# Patient Record
Sex: Female | Born: 1935
Health system: Southern US, Community
[De-identification: ages and names within clinical notes are randomized; demographics above are authoritative.]

## PROBLEM LIST (undated history)

## (undated) DIAGNOSIS — I10 Essential (primary) hypertension: Secondary | ICD-10-CM

## (undated) DIAGNOSIS — J449 Chronic obstructive pulmonary disease, unspecified: Secondary | ICD-10-CM

## (undated) DIAGNOSIS — D61818 Other pancytopenia: Secondary | ICD-10-CM

## (undated) DIAGNOSIS — K219 Gastro-esophageal reflux disease without esophagitis: Secondary | ICD-10-CM

## (undated) DIAGNOSIS — I059 Rheumatic mitral valve disease, unspecified: Secondary | ICD-10-CM

## (undated) DIAGNOSIS — M109 Gout, unspecified: Secondary | ICD-10-CM

## (undated) DIAGNOSIS — E785 Hyperlipidemia, unspecified: Secondary | ICD-10-CM

---

## 1977-06-26 HISTORY — PX: ABDOMINAL HYSTERECTOMY: SHX81

## 1998-11-24 ENCOUNTER — Ambulatory Visit (HOSPITAL_COMMUNITY): Admission: RE | Admit: 1998-11-24 | Discharge: 1998-11-24 | Payer: Self-pay | Admitting: Family Medicine

## 1998-11-24 ENCOUNTER — Encounter: Payer: Self-pay | Admitting: Family Medicine

## 2018-03-26 DIAGNOSIS — J449 Chronic obstructive pulmonary disease, unspecified: Secondary | ICD-10-CM

## 2018-03-26 DIAGNOSIS — I3481 Nonrheumatic mitral (valve) annulus calcification: Secondary | ICD-10-CM

## 2018-03-26 DIAGNOSIS — I059 Rheumatic mitral valve disease, unspecified: Secondary | ICD-10-CM

## 2018-03-26 HISTORY — DX: Chronic obstructive pulmonary disease, unspecified: J44.9

## 2018-03-26 HISTORY — DX: Rheumatic mitral valve disease, unspecified: I05.9

## 2018-03-26 HISTORY — DX: Nonrheumatic mitral (valve) annulus calcification: I34.81

## 2018-05-01 ENCOUNTER — Emergency Department (HOSPITAL_COMMUNITY): Payer: Medicare HMO

## 2018-05-01 ENCOUNTER — Observation Stay (HOSPITAL_COMMUNITY)
Admission: EM | Admit: 2018-05-01 | Discharge: 2018-05-03 | Disposition: A | Discharge status: Home / Self Care | Payer: Medicare HMO | Attending: Internal Medicine | Admitting: Internal Medicine

## 2018-05-01 ENCOUNTER — Other Ambulatory Visit: Payer: Self-pay

## 2018-05-01 ENCOUNTER — Encounter (HOSPITAL_COMMUNITY): Payer: Self-pay | Admitting: Pharmacy Technician

## 2018-05-01 DIAGNOSIS — Z833 Family history of diabetes mellitus: Secondary | ICD-10-CM | POA: Insufficient documentation

## 2018-05-01 DIAGNOSIS — I2693 Single subsegmental pulmonary embolism without acute cor pulmonale: Secondary | ICD-10-CM | POA: Diagnosis present

## 2018-05-01 DIAGNOSIS — I2699 Other pulmonary embolism without acute cor pulmonale: Principal | ICD-10-CM | POA: Insufficient documentation

## 2018-05-01 DIAGNOSIS — J439 Emphysema, unspecified: Secondary | ICD-10-CM | POA: Diagnosis not present

## 2018-05-01 DIAGNOSIS — R0789 Other chest pain: Secondary | ICD-10-CM | POA: Diagnosis not present

## 2018-05-01 DIAGNOSIS — D61818 Other pancytopenia: Secondary | ICD-10-CM | POA: Diagnosis not present

## 2018-05-01 DIAGNOSIS — G4733 Obstructive sleep apnea (adult) (pediatric): Secondary | ICD-10-CM | POA: Insufficient documentation

## 2018-05-01 DIAGNOSIS — Z66 Do not resuscitate: Secondary | ICD-10-CM | POA: Diagnosis not present

## 2018-05-01 DIAGNOSIS — I700 Atherosclerosis of aorta: Secondary | ICD-10-CM | POA: Insufficient documentation

## 2018-05-01 DIAGNOSIS — J449 Chronic obstructive pulmonary disease, unspecified: Secondary | ICD-10-CM | POA: Insufficient documentation

## 2018-05-01 DIAGNOSIS — M109 Gout, unspecified: Secondary | ICD-10-CM | POA: Insufficient documentation

## 2018-05-01 DIAGNOSIS — Z7901 Long term (current) use of anticoagulants: Secondary | ICD-10-CM | POA: Insufficient documentation

## 2018-05-01 DIAGNOSIS — Z880 Allergy status to penicillin: Secondary | ICD-10-CM | POA: Diagnosis not present

## 2018-05-01 DIAGNOSIS — E041 Nontoxic single thyroid nodule: Secondary | ICD-10-CM | POA: Diagnosis not present

## 2018-05-01 DIAGNOSIS — Z79899 Other long term (current) drug therapy: Secondary | ICD-10-CM | POA: Insufficient documentation

## 2018-05-01 DIAGNOSIS — I10 Essential (primary) hypertension: Secondary | ICD-10-CM | POA: Diagnosis not present

## 2018-05-01 DIAGNOSIS — K219 Gastro-esophageal reflux disease without esophagitis: Secondary | ICD-10-CM | POA: Insufficient documentation

## 2018-05-01 HISTORY — DX: Rheumatic mitral valve disease, unspecified: I05.9

## 2018-05-01 HISTORY — DX: Chronic obstructive pulmonary disease, unspecified: J44.9

## 2018-05-01 LAB — CBC WITH DIFFERENTIAL/PLATELET
Abs Immature Granulocytes: 0.01 10*3/uL (ref 0.00–0.07)
Basophils Absolute: 0 10*3/uL (ref 0.0–0.1)
Basophils Relative: 1 %
Eosinophils Absolute: 0.1 10*3/uL (ref 0.0–0.5)
Eosinophils Relative: 3 %
HCT: 33.5 % — ABNORMAL LOW (ref 36.0–46.0)
Hemoglobin: 10 g/dL — ABNORMAL LOW (ref 12.0–15.0)
Immature Granulocytes: 0 %
Lymphocytes Relative: 43 %
Lymphs Abs: 1.5 10*3/uL (ref 0.7–4.0)
MCH: 27.2 pg (ref 26.0–34.0)
MCHC: 29.9 g/dL — ABNORMAL LOW (ref 30.0–36.0)
MCV: 91.3 fL (ref 80.0–100.0)
Monocytes Absolute: 0.5 10*3/uL (ref 0.1–1.0)
Monocytes Relative: 13 %
Neutro Abs: 1.4 10*3/uL — ABNORMAL LOW (ref 1.7–7.7)
Neutrophils Relative %: 40 %
Platelets: 154 10*3/uL (ref 150–400)
RBC: 3.67 MIL/uL — ABNORMAL LOW (ref 3.87–5.11)
RDW: 14 % (ref 11.5–15.5)
WBC: 3.6 10*3/uL — ABNORMAL LOW (ref 4.0–10.5)
nRBC: 0 % (ref 0.0–0.2)

## 2018-05-01 LAB — I-STAT CHEM 8, ED
BUN: 23 mg/dL (ref 8–23)
CALCIUM ION: 1.3 mmol/L (ref 1.15–1.40)
CREATININE: 1 mg/dL (ref 0.44–1.00)
Chloride: 102 mmol/L (ref 98–111)
GLUCOSE: 103 mg/dL — AB (ref 70–99)
HCT: 33 % — ABNORMAL LOW (ref 36.0–46.0)
HEMOGLOBIN: 11.2 g/dL — AB (ref 12.0–15.0)
Potassium: 4 mmol/L (ref 3.5–5.1)
Sodium: 137 mmol/L (ref 135–145)
TCO2: 27 mmol/L (ref 22–32)

## 2018-05-01 LAB — COMPREHENSIVE METABOLIC PANEL
ALT: 17 U/L (ref 0–44)
AST: 28 U/L (ref 15–41)
Albumin: 4 g/dL (ref 3.5–5.0)
Alkaline Phosphatase: 38 U/L (ref 38–126)
Anion gap: 9 (ref 5–15)
BUN: 22 mg/dL (ref 8–23)
CO2: 23 mmol/L (ref 22–32)
Calcium: 9.8 mg/dL (ref 8.9–10.3)
Chloride: 103 mmol/L (ref 98–111)
Creatinine, Ser: 0.96 mg/dL (ref 0.44–1.00)
GFR calc Af Amer: >60 mL/min (ref >60)
GFR calc non Af Amer: 54 mL/min — ABNORMAL LOW (ref >60)
Glucose, Bld: 106 mg/dL — ABNORMAL HIGH (ref 70–99)
Potassium: 4 mmol/L (ref 3.5–5.1)
Sodium: 135 mmol/L (ref 135–145)
Total Bilirubin: 0.4 mg/dL (ref 0.3–1.2)
Total Protein: 7.2 g/dL (ref 6.5–8.1)

## 2018-05-01 LAB — I-STAT TROPONIN, ED
TROPONIN I, POC: 0 ng/mL (ref 0.00–0.08)
Troponin i, poc: 0.01 ng/mL (ref 0.00–0.08)

## 2018-05-01 MED ORDER — HEPARIN BOLUS VIA INFUSION
4000.0000 [IU] | Freq: Once | INTRAVENOUS | Status: AC
  Administered 2018-05-02: 4000 [IU] via INTRAVENOUS
  Filled 2018-05-01: qty 4000

## 2018-05-01 MED ORDER — ALBUTEROL SULFATE (2.5 MG/3ML) 0.083% IN NEBU
5.0000 mg | INHALATION_SOLUTION | Freq: Once | RESPIRATORY_TRACT | Status: AC
  Administered 2018-05-01: 5 mg via RESPIRATORY_TRACT
  Filled 2018-05-01: qty 6

## 2018-05-01 MED ORDER — IPRATROPIUM BROMIDE 0.02 % IN SOLN
0.5000 mg | Freq: Once | RESPIRATORY_TRACT | Status: AC
  Administered 2018-05-01: 0.5 mg via RESPIRATORY_TRACT
  Filled 2018-05-01: qty 2.5

## 2018-05-01 MED ORDER — FAMOTIDINE IN NACL 20-0.9 MG/50ML-% IV SOLN
20.0000 mg | Freq: Once | INTRAVENOUS | Status: AC
  Administered 2018-05-01: 20 mg via INTRAVENOUS
  Filled 2018-05-01: qty 50

## 2018-05-01 MED ORDER — IOPAMIDOL (ISOVUE-370) INJECTION 76%
INTRAVENOUS | Status: AC
  Filled 2018-05-01: qty 100

## 2018-05-01 MED ORDER — HEPARIN (PORCINE) 25000 UT/250ML-% IV SOLN
1100.0000 [IU]/h | INTRAVENOUS | Status: DC
  Administered 2018-05-02: 1100 [IU]/h via INTRAVENOUS
  Filled 2018-05-01: qty 250

## 2018-05-01 MED ORDER — IOPAMIDOL (ISOVUE-370) INJECTION 76%
100.0000 mL | Freq: Once | INTRAVENOUS | Status: AC | PRN
  Administered 2018-05-01: 100 mL via INTRAVENOUS

## 2018-05-01 NOTE — ED Notes (Signed)
Patient transported to CT 

## 2018-05-01 NOTE — ED Provider Notes (Signed)
[facility] EMERGENCY DEPARTMENT Provider Note   CSN: [id number] Arrival date & time: 05/01/18  2024     History   Chief Complaint Chief Complaint  Patient presents with  . Shortness of Breath    HPI Timmi Devora is a 82 y.o. female history of COPD, hypertension, thyroid nodule here presenting with shortness of breath.  Patient has intermittent shortness of breath for the last several months.  Patient occasionally has spells where she had trouble breathing.  This afternoon, patient an episode where she could not catch her breath and called her family.  Her family called EMS and brought her to the ER.  Of note, she saw pulmonology about a month ago and was diagnosed with COPD.  She is not currently on any steroids or inhalers.  Patient also had recent thyroid nodule and had a biopsy by ENT and a normal TSH.  Patient was seen in the ED and notified about 2 weeks ago for the same complaint and troponins were negative at that time BNP was normal.  Patient was supposed to see cardiology next month and patient does not have known coronary artery disease or stents in her heart.  Patient has no recent travel or history of blood clots.   The history is provided by the patient and a relative.    History reviewed. No pertinent past medical history.  There are no active problems to display for this patient.   History reviewed. No pertinent surgical history.   OB History   None      Home Medications    Prior to Admission medications   Not on File    Family History No family history on file.  Social History Social History   Tobacco Use  . Smoking status: Not on file  Substance Use Topics  . Alcohol use: Not on file  . Drug use: Not on file     Allergies   Patient has no allergy information on record.   Review of Systems Review of Systems  Respiratory: Positive for shortness of breath.   All other systems reviewed and are  negative.    Physical Exam Updated Vital Signs BP (!) 171/72   Pulse 68   Temp 98 F (36.7 C) (Oral)   Resp 16   Ht 5\' 4"  (1.626 m)   Wt 86.6 kg   SpO2 97%   BMI 32.79 kg/m   Physical Exam  Constitutional: She is oriented to person, place, and time.  Slightly anxious, well appearing   HENT:  Head: Normocephalic.  Mouth/Throat: Oropharynx is clear and moist.  Eyes: Pupils are equal, round, and reactive to light. EOM are normal.  Neck: Normal range of motion. Neck supple.  Cardiovascular: Normal rate and regular rhythm.  Pulmonary/Chest: Effort normal.  Diminished breath sounds bilaterally, no obvious wheezing   Abdominal: Soft. Bowel sounds are normal.  Musculoskeletal: Normal range of motion.       Right lower leg: Normal. She exhibits no tenderness and no edema.       Left lower leg: Normal. She exhibits no tenderness and no edema.  Neurological: She is alert and oriented to person, place, and time.  Skin: Skin is warm. Capillary refill takes less than 2 seconds.  Psychiatric: She has a normal mood and affect. Her behavior is normal.  Nursing note and vitals reviewed.    ED Treatments / Results  Labs (all labs ordered are listed, but only abnormal results are displayed) Labs Reviewed  CBC WITH DIFFERENTIAL/PLATELET - Abnormal; Notable for the following components:      Result Value   WBC 3.6 (*)    RBC 3.67 (*)    Hemoglobin 10.0 (*)    HCT 33.5 (*)    MCHC 29.9 (*)    Neutro Abs 1.4 (*)    All other components within normal limits  COMPREHENSIVE METABOLIC PANEL - Abnormal; Notable for the following components:   Glucose, Bld 106 (*)    GFR calc non Af Amer 54 (*)    All other components within normal limits  I-STAT CHEM 8, ED - Abnormal; Notable for the following components:   Glucose, Bld 103 (*)    Hemoglobin 11.2 (*)    HCT 33.0 (*)    All other components within normal limits  I-STAT TROPONIN, ED    EKG None  Radiology Dg Chest 2  View  Result Date: 05/01/2018 CLINICAL DATA:  Shortness of breath EXAM: CHEST - 2 VIEW COMPARISON:  04/21/2016 FINDINGS: The heart size and mediastinal contours are within normal limits. Aortic atherosclerosis. Both lungs are clear. IMPRESSION: No active cardiopulmonary disease. Electronically Signed   By: Jasmine Pang M.D.   On: 05/01/2018 21:52    Procedures Procedures (including critical care time)  CRITICAL CARE Performed by: Richardean Canal   Total critical care time:30 minutes  Critical care time was exclusive of separately billable procedures and treating other patients.  Critical care was necessary to treat or prevent imminent or life-threatening deterioration.  Critical care was time spent personally by me on the following activities: development of treatment plan with patient and/or surrogate as well as nursing, discussions with consultants, evaluation of patient's response to treatment, examination of patient, obtaining history from patient or surrogate, ordering and performing treatments and interventions, ordering and review of laboratory studies, ordering and review of radiographic studies, pulse oximetry and re-evaluation of patient's condition.   Medications Ordered in ED Medications  iopamidol (ISOVUE-370) 76 % injection (has no administration in time range)  albuterol (PROVENTIL) (2.5 MG/3ML) 0.083% nebulizer solution 5 mg (5 mg Nebulization Given 05/01/18 2055)  ipratropium (ATROVENT) nebulizer solution 0.5 mg (0.5 mg Nebulization Given 05/01/18 2055)  famotidine (PEPCID) IVPB 20 mg premix (0 mg Intravenous Stopped 05/01/18 2234)     Initial Impression / Assessment and Plan / ED Course  I have reviewed the triage vital signs and the nursing notes.  Pertinent labs & imaging results that were available during my care of the patient were reviewed by me and considered in my medical decision making (see chart for details).    Mikaylee Arseneau is a 82 y.o. female here  with intermittent palpitations, shortness of breath. Etiology seemed unclear. Patient had multiple PCP visits, pulmonology evaluation and recent ED visit with no clear answer. I wonder if she has mild bronchitis causing her symptoms. Also consider PE as well. Will get labs, CTA chest, give neb and reassess.    11:49 PM CTA showed possible non occlusive small PEs. Given her age and comorbidities, will start heparin and admit for DVT study and observation. Hospitalist to admit.   Final Clinical Impressions(s) / ED Diagnoses   Final diagnoses:  None    ED Discharge Orders    None       Charlynne Pander, MD 05/01/18 2350

## 2018-05-01 NOTE — ED Notes (Signed)
Pt returned from imaging.

## 2018-05-01 NOTE — Progress Notes (Signed)
ANTICOAGULATION CONSULT NOTE - Initial Consult  Pharmacy Consult for Heparin Indication: pulmonary embolus  Allergies not on file  Patient Measurements: Height: 5\' 4"  (162.6 cm) Weight: 191 lb (86.6 kg) IBW/kg (Calculated) : 54.7 Heparin Dosing Weight: 75 kg  Vital Signs: Temp: 98 F (36.7 C) (11/06 2037) Temp Source: Oral (11/06 2037) BP: 146/67 (11/06 2315) Pulse Rate: 79 (11/06 2315)  Labs: Recent Labs    05/01/18 2040 05/01/18 2112  HGB 10.0* 11.2*  HCT 33.5* 33.0*  PLT 154  --   CREATININE 0.96 1.00    Estimated Creatinine Clearance: 47.8 mL/min (by C-G formula based on SCr of 1 mg/dL).   Medical History: History reviewed. No pertinent past medical history.  Medications:  Allopurinol  Xalatan  Crestor  Travatan  Diovan HCT  Assessment: 82 y.o. female with RLL PE for heparin  Goal of Therapy:  Heparin level 0.3-0.7 units/ml Monitor platelets by anticoagulation protocol: Yes   Plan:  Heparin 4000 units IV bolus, then start heparin 1100 units/hr Check heparin level in 8 hours.   Rebekah Macdonald, Gary Fleet 05/01/2018,11:42 PM

## 2018-05-01 NOTE — ED Triage Notes (Signed)
Pt arrives POV with reports of intermittent sob for "a while" worsening over the last 6 weeks. Pt speaking in complete sentences. NAD.

## 2018-05-02 ENCOUNTER — Other Ambulatory Visit: Payer: Self-pay

## 2018-05-02 ENCOUNTER — Encounter (HOSPITAL_COMMUNITY): Payer: Self-pay | Admitting: Emergency Medicine

## 2018-05-02 DIAGNOSIS — I2694 Multiple subsegmental pulmonary emboli without acute cor pulmonale: Secondary | ICD-10-CM

## 2018-05-02 DIAGNOSIS — I2699 Other pulmonary embolism without acute cor pulmonale: Secondary | ICD-10-CM | POA: Diagnosis present

## 2018-05-02 LAB — IRON AND TIBC
Iron: 48 ug/dL (ref 28–170)
Saturation Ratios: 15 % (ref 10.4–31.8)
TIBC: 315 ug/dL (ref 250–450)
UIBC: 267 ug/dL

## 2018-05-02 LAB — PROTIME-INR
INR: 1.07
Prothrombin Time: 13.8 seconds (ref 11.4–15.2)

## 2018-05-02 LAB — CBC
HCT: 31.5 % — ABNORMAL LOW (ref 36.0–46.0)
Hemoglobin: 9.6 g/dL — ABNORMAL LOW (ref 12.0–15.0)
MCH: 27.4 pg (ref 26.0–34.0)
MCHC: 30.5 g/dL (ref 30.0–36.0)
MCV: 89.7 fL (ref 80.0–100.0)
PLATELETS: 151 10*3/uL (ref 150–400)
RBC: 3.51 MIL/uL — ABNORMAL LOW (ref 3.87–5.11)
RDW: 14 % (ref 11.5–15.5)
WBC: 3.6 10*3/uL — AB (ref 4.0–10.5)
nRBC: 0 % (ref 0.0–0.2)

## 2018-05-02 LAB — BRAIN NATRIURETIC PEPTIDE: B Natriuretic Peptide: 90 pg/mL (ref 0.0–100.0)

## 2018-05-02 LAB — BASIC METABOLIC PANEL
Anion gap: 10 (ref 5–15)
BUN: 19 mg/dL (ref 8–23)
CALCIUM: 9.9 mg/dL (ref 8.9–10.3)
CO2: 22 mmol/L (ref 22–32)
Chloride: 104 mmol/L (ref 98–111)
Creatinine, Ser: 0.92 mg/dL (ref 0.44–1.00)
GFR calc Af Amer: >60 mL/min (ref >60)
GFR, EST NON AFRICAN AMERICAN: 57 mL/min — AB (ref >60)
GLUCOSE: 104 mg/dL — AB (ref 70–99)
Potassium: 3.7 mmol/L (ref 3.5–5.1)
SODIUM: 136 mmol/L (ref 135–145)

## 2018-05-02 LAB — VITAMIN B12: VITAMIN B 12: 571 pg/mL (ref 180–914)

## 2018-05-02 LAB — TROPONIN I

## 2018-05-02 LAB — APTT: APTT: 131 s — AB (ref 24–36)

## 2018-05-02 LAB — FERRITIN: Ferritin: 32 ng/mL (ref 11–307)

## 2018-05-02 MED ORDER — ALUM & MAG HYDROXIDE-SIMETH 200-200-20 MG/5ML PO SUSP
15.0000 mL | Freq: Four times a day (QID) | ORAL | Status: DC | PRN
  Administered 2018-05-02 – 2018-05-03 (×4): 15 mL via ORAL
  Filled 2018-05-02 (×4): qty 30

## 2018-05-02 MED ORDER — OMEGA-3-ACID ETHYL ESTERS 1 G PO CAPS
1.0000 g | ORAL_CAPSULE | Freq: Two times a day (BID) | ORAL | Status: DC
  Administered 2018-05-02 – 2018-05-03 (×3): 1 g via ORAL
  Filled 2018-05-02 (×3): qty 1

## 2018-05-02 MED ORDER — ENOXAPARIN SODIUM 80 MG/0.8ML ~~LOC~~ SOLN
80.0000 mg | Freq: Once | SUBCUTANEOUS | Status: AC
  Administered 2018-05-02: 80 mg via SUBCUTANEOUS
  Filled 2018-05-02: qty 0.8

## 2018-05-02 MED ORDER — IPRATROPIUM-ALBUTEROL 0.5-2.5 (3) MG/3ML IN SOLN
3.0000 mL | Freq: Four times a day (QID) | RESPIRATORY_TRACT | Status: DC
  Administered 2018-05-02: 3 mL via RESPIRATORY_TRACT
  Filled 2018-05-02: qty 3

## 2018-05-02 MED ORDER — IPRATROPIUM-ALBUTEROL 0.5-2.5 (3) MG/3ML IN SOLN: 3.0000 mL | Freq: Four times a day (QID) | RESPIRATORY_TRACT | Status: DC

## 2018-05-02 MED ORDER — ENOXAPARIN SODIUM 80 MG/0.8ML ~~LOC~~ SOLN: 80.0000 mg | Freq: Two times a day (BID) | SUBCUTANEOUS | Status: DC

## 2018-05-02 MED ORDER — SIMETHICONE 80 MG PO CHEW
80.0000 mg | CHEWABLE_TABLET | Freq: Four times a day (QID) | ORAL | Status: DC | PRN
  Administered 2018-05-03: 80 mg via ORAL
  Filled 2018-05-02 (×3): qty 1

## 2018-05-02 MED ORDER — APIXABAN 5 MG PO TABS: 5.0000 mg | ORAL_TABLET | Freq: Two times a day (BID) | ORAL | Status: DC

## 2018-05-02 MED ORDER — APIXABAN 5 MG PO TABS
10.0000 mg | ORAL_TABLET | Freq: Two times a day (BID) | ORAL | Status: DC
  Administered 2018-05-02 – 2018-05-03 (×2): 10 mg via ORAL
  Filled 2018-05-02 (×2): qty 2

## 2018-05-02 MED ORDER — ALBUTEROL SULFATE (2.5 MG/3ML) 0.083% IN NEBU: 3.0000 mL | INHALATION_SOLUTION | Freq: Four times a day (QID) | RESPIRATORY_TRACT | Status: DC | PRN

## 2018-05-02 MED ORDER — ALLOPURINOL 100 MG PO TABS
100.0000 mg | ORAL_TABLET | Freq: Every day | ORAL | Status: DC
  Administered 2018-05-02 – 2018-05-03 (×2): 100 mg via ORAL
  Filled 2018-05-02 (×2): qty 1

## 2018-05-02 MED ORDER — UMECLIDINIUM BROMIDE 62.5 MCG/INH IN AEPB
1.0000 | INHALATION_SPRAY | Freq: Every day | RESPIRATORY_TRACT | Status: DC
  Administered 2018-05-02: 1 via RESPIRATORY_TRACT
  Filled 2018-05-02: qty 7

## 2018-05-02 MED ORDER — ENOXAPARIN SODIUM 100 MG/ML ~~LOC~~ SOLN: 1.0000 mg/kg | Freq: Two times a day (BID) | SUBCUTANEOUS | Status: DC

## 2018-05-02 MED ORDER — ROSUVASTATIN CALCIUM 10 MG PO TABS
10.0000 mg | ORAL_TABLET | Freq: Every day | ORAL | Status: DC
  Administered 2018-05-02: 10 mg via ORAL
  Filled 2018-05-02: qty 1

## 2018-05-02 MED ORDER — PANTOPRAZOLE SODIUM 40 MG PO TBEC
40.0000 mg | DELAYED_RELEASE_TABLET | Freq: Every day | ORAL | Status: DC
  Administered 2018-05-02 – 2018-05-03 (×2): 40 mg via ORAL
  Filled 2018-05-02 (×2): qty 1

## 2018-05-02 MED ORDER — LATANOPROST 0.005 % OP SOLN
1.0000 [drp] | Freq: Every day | OPHTHALMIC | Status: DC
  Filled 2018-05-02 (×2): qty 2.5

## 2018-05-02 MED ORDER — VITAMIN D 25 MCG (1000 UNIT) PO TABS
5000.0000 [IU] | ORAL_TABLET | Freq: Every day | ORAL | Status: DC
  Administered 2018-05-02 – 2018-05-03 (×2): 5000 [IU] via ORAL

## 2018-05-02 MED ORDER — VALSARTAN-HYDROCHLOROTHIAZIDE 160-12.5 MG PO TABS: 1.0000 | ORAL_TABLET | Freq: Every day | ORAL | Status: DC

## 2018-05-02 MED ORDER — FLUTICASONE PROPIONATE 50 MCG/ACT NA SUSP
1.0000 | Freq: Every day | NASAL | Status: DC | PRN
  Filled 2018-05-02: qty 16

## 2018-05-02 MED ORDER — ACETAMINOPHEN 325 MG PO TABS
650.0000 mg | ORAL_TABLET | Freq: Two times a day (BID) | ORAL | Status: DC
  Administered 2018-05-02 – 2018-05-03 (×4): 650 mg via ORAL
  Filled 2018-05-02 (×4): qty 2

## 2018-05-02 MED ORDER — IRBESARTAN 150 MG PO TABS
150.0000 mg | ORAL_TABLET | Freq: Every day | ORAL | Status: DC
  Administered 2018-05-02 – 2018-05-03 (×2): 150 mg via ORAL
  Filled 2018-05-02 (×2): qty 1

## 2018-05-02 MED ORDER — SODIUM CHLORIDE 0.9% FLUSH
3.0000 mL | Freq: Two times a day (BID) | INTRAVENOUS | Status: DC
  Administered 2018-05-02 – 2018-05-03 (×4): 3 mL via INTRAVENOUS

## 2018-05-02 MED ORDER — HYDROCHLOROTHIAZIDE 12.5 MG PO CAPS
12.5000 mg | ORAL_CAPSULE | Freq: Every day | ORAL | Status: DC
  Administered 2018-05-02 – 2018-05-03 (×2): 12.5 mg via ORAL
  Filled 2018-05-02 (×2): qty 1

## 2018-05-02 MED ORDER — IPRATROPIUM-ALBUTEROL 0.5-2.5 (3) MG/3ML IN SOLN
3.0000 mL | Freq: Two times a day (BID) | RESPIRATORY_TRACT | Status: DC
  Administered 2018-05-02 – 2018-05-03 (×2): 3 mL via RESPIRATORY_TRACT
  Filled 2018-05-02 (×2): qty 3

## 2018-05-02 NOTE — Progress Notes (Signed)
Pt complaining of upper sternal chest pain described as tightness at an intensity of 6.  Dr. Benjamine Mola was notified and will assess patient.

## 2018-05-02 NOTE — Care Management Note (Signed)
Case Management Note  Patient Details  Name: Rebekah Macdonald MRN: [id number] Date of Birth: Aug 17, 1935  Subjective/Objective: 82yo female presented with SOB.                    Action/Plan: CM met with patient and her 2 daughters to discuss transitional needs. Patient lives at home alone, independent with ADLs with no DME in use. PCP verified as: Dr. Romeo Rabon; pharmacy of choice: [facility]. Eliquis benefits check complete, with est monthly cost $45, with patient/daughters informed.Patient will receive a 30 day free supply of Eliquis, and has requested to utilize [facility] pharmacy service. Patient's daughters will provide transportation home. No further CM needs.  Expected Discharge Date:  05/03/18               Expected Discharge Plan:  Home/Self Care  In-House Referral:  NA  Discharge planning Services  CM Consult, Medication Assistance(Anticoagulation benefits check)  Post Acute Care Choice:  NA Choice offered to:  NA  DME Arranged:  N/A DME Agency:  NA  HH Arranged:  NA HH Agency:  NA  Status of Service:  Completed, signed off  If discussed at [place] of Stay Meetings, dates discussed:    Additional Comments:  Midge Minium RN, BSN, NCM-BC, ACM-RN [phone] 05/02/2018, 1:19 PM

## 2018-05-02 NOTE — Progress Notes (Signed)
Patient admitted into 5c12 accompanied by daughter. On arrival patient A/O x4, ambulatory and Neuro intact. Oriented to the room and call bell placed within reach.

## 2018-05-02 NOTE — Discharge Instructions (Signed)
Information on my medicine - ELIQUIS (apixaban)  This medication education was reviewed with me or my healthcare representative as part of my discharge preparation.    Why was Eliquis prescribed for you? Eliquis was prescribed to treat blood clots that may have been found in the veins of your legs (deep vein thrombosis) or in your lungs (pulmonary embolism) and to reduce the risk of them occurring again.  What do You need to know about Eliquis ? The starting dose is 10 mg (two 5 mg tablets) taken TWICE daily for the FIRST SEVEN (7) DAYS, then on 11/14 evening the dose is reduced to ONE 5 mg tablet taken TWICE daily.  Eliquis may be taken with or without food.   Try to take the dose about the same time in the morning and in the evening. If you have difficulty swallowing the tablet whole please discuss with your pharmacist how to take the medication safely.  Take Eliquis exactly as prescribed and DO NOT stop taking Eliquis without talking to the doctor who prescribed the medication.  Stopping may increase your risk of developing a new blood clot.  Refill your prescription before you run out.  After discharge, you should have regular check-up appointments with your healthcare provider that is prescribing your Eliquis.    What do you do if you miss a dose? If a dose of ELIQUIS is not taken at the scheduled time, take it as soon as possible on the same day and twice-daily administration should be resumed. The dose should not be doubled to make up for a missed dose.  Important Safety Information A possible side effect of Eliquis is bleeding. You should call your healthcare provider right away if you experience any of the following: ? Bleeding from an injury or your nose that does not stop. ? Unusual colored urine (red or dark brown) or unusual colored stools (red or black). ? Unusual bruising for unknown reasons. ? A serious fall or if you hit your head (even if there is no  bleeding).  Some medicines may interact with Eliquis and might increase your risk of bleeding or clotting while on Eliquis. To help avoid this, consult your healthcare provider or pharmacist prior to using any new prescription or non-prescription medications, including herbals, vitamins, non-steroidal anti-inflammatory drugs (NSAIDs) and supplements.  This website has more information on Eliquis (apixaban): http://www.eliquis.com/eliquis/home

## 2018-05-02 NOTE — Care Management Obs Status (Signed)
MEDICARE OBSERVATION STATUS NOTIFICATION   Patient Details  Name: Rebekah Macdonald MRN: [id number] Date of Birth: [birth year]   Medicare Observation Status Notification Given:  Yes    Colleen Can RN, BSN, NCM-BC, ACM-RN [phone] 05/02/2018, 4:28 PM

## 2018-05-02 NOTE — Progress Notes (Signed)
ANTICOAGULATION CONSULT NOTE - Follow Up Consult  Pharmacy Consult for Lovenox >> Apixaban Indication: pulmonary embolus  Allergies  Allergen Reactions  . Ace Inhibitors Cough  . Penicillins Nausea And Vomiting    Patient Measurements: Height: 5' 4.5" (163.8 cm) Weight: 191 lb 12.8 oz (87 kg) IBW/kg (Calculated) : 55.85  Vital Signs: Temp: 97.8 F (36.6 C) (11/07 0530) Temp Source: Oral (11/07 0530) BP: 154/67 (11/07 0530) Pulse Rate: 71 (11/07 0818)  Labs: Recent Labs    05/01/18 2040 05/01/18 2112 05/02/18 0116  HGB 10.0* 11.2* 9.6*  HCT 33.5* 33.0* 31.5*  PLT 154  --  151  APTT  --   --  131*  LABPROT  --   --  13.8  INR  --   --  1.07  CREATININE 0.96 1.00 0.92    Estimated Creatinine Clearance: 52.6 mL/min (by C-G formula based on SCr of 0.92 mg/dL).   Assessment: 39 YOF who presented on 11/7 with SOB and found to have a small RLL PE. Pharmacy consulted to transition the patient from lovenox to apixaban.   The patient last received lovenox 80 mg this morning around 0400  Goal of Therapy:  Appropriate anticoagulation for indication and hepatic/renal function    Plan:  - D/c Lovenox - Start Apixaban 10 mg bid x 7 days followed by 5 mg bid - Educated patient and daughters on new medication - Pharmacy will sign off of consult and continue to monitor peripherally  Thank you for allowing pharmacy to be a part of this patient's care.  Georgina Pillion, PharmD, BCPS Clinical Pharmacist Pager: [phone] Clinical phone for 05/02/2018 from 7a-3:30p: [phone] If after 3:30p, please call main pharmacy at: [phone] Please check AMION for all [facility] Pharmacy numbers 05/02/2018 12:51 PM

## 2018-05-02 NOTE — H&P (Signed)
History and Physical   Rebekah Macdonald ZOX:[id number] DOB: September 12, 1935 DOA: 05/01/2018  PCP: [name], PA-C  Chief Complaint: shortness of breath  History is obtained via chart review, as well as patient and daughter report.  HPI:  This is an 82 year old woman with medical problems including pulmonary arterial hypertension based on CT scan, chronic anemia with baseline hemoglobin of 10, with chronic pancytopenia nontransfusion dependent, mild COPD based on PFTs in the year 2019, untreated OSA-reports intolerance to positive airway pressure, hypertension, chronic arthritis on acetaminophen twice daily, elevated cardiac risk on statin, gout on allopurinol, who lives independently, does not require assistance for ADLs and IADLs presenting with worsening dyspnea.  Over the course of the past year, the patient cites a decrease in exercise tolerance.  She is accompanied by her daughter.  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  She has had an insidious onset of dyspnea on exertion over the past 9 to 10 months per her report.  She now has difficulty climbing a flight of stairs.  Her dyspnea has at times progressed to dyspnea at rest, she cites having "spells".  She denies a positional component.  Does not have productive cough, fevers, nausea, vomiting.  She does report that she had intermittent chest pressure, no current chest pain.  Intermittent palpitations.  Recent evaluations by her pulmonologist has resulted in cardiology referral.  In the month of October 2018 she underwent transthoracic echocardiogram which revealed EF of 70%, right ventricular systolic pressure of .  Underwent high-resolution CT on April 13, 2018 that revealed emphysema as well as enlarged pulmonary artery.  She was started on Spiriva in the outpatient setting.  Not oxygen dependent.   Due to her worsening dyspnea, sought medical evaluation today.  The patient denies any history of prior blood clots.  Specifically denies any family history of VTE.  Denies prolonged travel.  Reports mammogram and colonoscopy within the past year that were normal.  Denies any weight loss or B symptoms.  ED Course: In the emergency department heart rate normal, respiratory rate mildly elevated 22, systolic blood pressure 160s.  Lab evaluation remarkable for hemoglobin 10, MCV 91, total white count 3.6, plt 154.  BMP remarkable for creatinine of 1.  Troponin normal.  Chest x-ray did not reveal acute cardia pleural disease.  A CT PE study was obtained which revealed 2 possible tiny right lower lobe segmental/subsegmental emboli, as well as unchanged pulmonary arterial enlargement compatible with pulmonary arterial hypertension.  Review of Systems: A complete ROS was obtained; pertinent positives negatives are denoted in the HPI. Otherwise, all systems are negative.   Past medical hx: In addition to medical conditions outlined above, she has undergone hysterectomy for fibroids, as well as bilateral arthroscopic surgery to her knees.  Social History   Socioeconomic History  . Marital status: Widowed    Spouse name: Not on file  . Number of children: Not on file  . Years of education: Not on file  . Highest education level: Not on file  Occupational History  . Not on file  Social Needs  . Financial resource strain: Not on file  . Food insecurity:    Worry: Not on file    Inability: Not on file  . Transportation needs:    Medical: Not on file    Non-medical: Not on file  Tobacco Use  .  Smoking status: Not on file  Substance and Sexual Activity  . Alcohol use: Not on file  . Drug use: Not on file  . Sexual activity: Not on file  Lifestyle  . Physical activity:    Days per week: Not on file    Minutes per session: Not on file  . Stress: Not on file  Relationships  . Social connections:     Talks on phone: Not on file    Gets together: Not on file    Attends religious service: Not on file    Active member of club or organization: Not on file    Attends meetings of clubs or organizations: Not on file    Relationship status: Not on file  . Intimate partner violence:    Fear of current or ex partner: Not on file    Emotionally abused: Not on file    Physically abused: Not on file    Forced sexual activity: Not on file  Other Topics Concern  . Not on file  Social History Narrative  . Not on file   Family hx: Family history remarkable for mother with diabetes, deceased.  Father died of prostate cancer.  Physical Exam: Vitals:   05/01/18 2215 05/01/18 2230 05/01/18 2300 05/01/18 2315  BP: (!) 146/65 (!) 150/56 140/60 (!) 146/67  Pulse: 74 80 75 79  Resp: (!) 21 19 14 17   Temp:      TempSrc:      SpO2: 97% 97% 99% 98%  Weight:      Height:       General: Appears calm and comfortable.  Elderly black woman. ENT: Grossly normal hearing, MMM. Cardiovascular: RRR. No M/R/G. No LE edema or pain in b/l calf regions Respiratory: CTA bilaterally. No wheezes or crackles. Normal respiratory effort.  Breathing room air. Lung sounds distant. Abdomen: Soft, non-tender. No rebound or guarding..  Skin: No rash or induration seen on limited exam. Musculoskeletal: Grossly normal tone BUE/BLE. Appropriate ROM.  Psychiatric: Grossly normal mood and affect. Neurologic: Moves all extremities in coordinated fashion.  I have personally reviewed the following labs, culture data, and imaging studies.  Assessment/Plan:  #Pulmonary emboli Course: Has had CT of chest without contrast, TTE, and PFTs within the past few months.  Worsening dyspnea over the past year, now with worsening over the past 1-2 weeks with intermittent spells of dyspnea at rest and with minimal ambulation.  On admission, was found to have 2 small segmenta / subsegmental pulmonary emboli. A/P: Her acute on subacute /  chronic dyspnea is likely multifactorial - untreated OSA in setting of chronic anemia superimposed on mild COPD.  It is likely that pulmonary emboli has caused or contributed to acute worsening of dyspnea.  PESI score indicates low risk.   Hemodynamically stable.   No clear provoking factors.  Uncertain if PAH seen on CT is related to chronic thromboembolic disease pulmonary hypertension (CTEPH) or more so from untreated OSA.  Initially emergency medicine team started [facility] with heparin gtt; however, after talking with pharmacy team, will transition to therapeutic LMWH.  Asking case management to run pricing on affordable DOACs which will be reasonable to transition to as early as later in the day on 05/02/2018.  Patient and daughter understand benefits and burdens/risks to [facility], agreeable to start [facility].  #Other problems: -Chronic arthritis: not in exacerbation, continue acetaminophen BID -Gout: not in flare, continue home allopurinol -HTN: holding ARB / HCTZ for now, consider re-initiation later in day -Hx of pancytopenia,  now with mild leukopenia and anemia: hb appears at baseline (~10), no evidence of active bleed, monitor, is followed by outpatient hematology, Dr. Neil Crouch -COPD, mild: continue home Spiriva, as needed duo nebs -OSA: intolerant to CPAP, will likely need OP sleep study to determine optimal management  DVT prophylaxis: Subq Lovenox - treatment dose Code Status: DNR, but is willing to be intubated if respiratory failure Disposition Plan: Anticipate D/C home within 2 days Consults called: none Admission status: admit to hospital medicine   Laurell Roof, MD [facility] Page:[phone]  If 7PM-7AM, please contact night-coverage [email or web address] Password [email or web address]  This document was created using the aid of voice recognition / dication software.

## 2018-05-02 NOTE — Care Management (Signed)
#    2.  Rebekah Macdonald  @  [facility] [employer]            RX  # [phone]          APIXABAN : NONE FORMULARY  1. ELIQUIS  2.5 MG BID COVER- YES CO-PAY - $ 45.00 TIER- 3 DRUG PRIOR APPROVAL- NO  2. ELIQUIS  5 MG BID COVER- YES CO-PAY-$ 45.00 TIER- 3 DRUG PRIOR APPROVAL- NO  RIVAROXABAN : NONE FORMULARY  3. XARELTO 15 MG BID COVER- YES CO-PAY- $ 45.00 TIER- 3 DRUG PRIOR APPROVAL- NO  4. XARELTO  20 MG DAILY COVER- YES CO-PAY- $ 45.00 TIER- 3 DRUG PRIOR APPROVAL- NO  5. LOVENOX   80 MG SYRINGES COVER- NONE FORMULARY PRIOR APPROVAL- YES # [phone]  6. ENOXAPARIN  80 MG SYRINGES  COVER- : NONE FORMULARY PRIOR APPROVAL- YES # [phone]  PREFERRED PHARMACY : YES - [facility]

## 2018-05-02 NOTE — Progress Notes (Signed)
Patient admitted after midnight, please see H&P.  Here with PE.  Most medical records are in care everywhere through [place].  Will place on tele, get troponin.  Recent echo done at [facility]   Had episode of upper sternal tightness-- not clearly GI related but suspicious for, but will r/o cardiac etiology. Plan to watch overnight on tele with AM labs  Marlin Canary DO

## 2018-05-03 DIAGNOSIS — I2694 Multiple subsegmental pulmonary emboli without acute cor pulmonale: Secondary | ICD-10-CM | POA: Diagnosis not present

## 2018-05-03 MED ORDER — CALCIUM CARBONATE ANTACID 500 MG PO CHEW
1.0000 | CHEWABLE_TABLET | Freq: Three times a day (TID) | ORAL | Status: DC
  Administered 2018-05-03: 200 mg via ORAL
  Filled 2018-05-03: qty 1

## 2018-05-03 MED ORDER — ELIQUIS 5 MG VTE STARTER PACK: ORAL_TABLET | ORAL | 0 refills | Status: DC

## 2018-05-03 MED ORDER — SIMETHICONE 80 MG PO CHEW: 80.0000 mg | CHEWABLE_TABLET | Freq: Four times a day (QID) | ORAL | 0 refills | Status: DC | PRN

## 2018-05-03 MED ORDER — CALCIUM CARBONATE ANTACID 500 MG PO CHEW: 1.0000 | CHEWABLE_TABLET | Freq: Three times a day (TID) | ORAL | Status: DC | PRN

## 2018-05-03 MED ORDER — PANTOPRAZOLE SODIUM 40 MG PO TBEC: 40.0000 mg | DELAYED_RELEASE_TABLET | Freq: Every day | ORAL | 0 refills | Status: DC

## 2018-05-03 MED FILL — ELIQUIS STARTER PACK 5 MG T: 5 | 30 days supply | Qty: 74 | Fill #0

## 2018-05-03 NOTE — Discharge Summary (Signed)
Physician Discharge Summary  Rebekah Macdonald ZOX:[id number] DOB: 1935-11-11 DOA: 05/01/2018  PCP: [name], PA-C  Admit date: 05/01/2018 Discharge date: 05/03/2018  Admitted From: home Discharge disposition: home   Recommendations for Outpatient Follow-Up:   1. Cardiology follow up 2. GI referral if continues to have acid reflux   Discharge Diagnosis:   Active Problems:   Pulmonary embolism [facility])    Discharge Condition: Improved.  Diet recommendation: Low sodium, heart healthy  Wound care: None.  Code status: Full.   History of Present Illness:   82 year old woman with medical problems including pulmonary arterial hypertension based on CT scan, chronic anemia with baseline hemoglobin of 10, with chronic pancytopenia nontransfusion dependent, mild COPD based on PFTs in the year 2019, untreated OSA-reports intolerance to positive airway pressure, hypertension, chronic arthritis on acetaminophen twice daily, elevated cardiac risk on statin, gout on allopurinol, who lives independently, does not require assistance for ADLs and IADLs presenting with worsening dyspnea.  Over the course of the past year, the patient cites a decrease in exercise tolerance.  She is accompanied by her daughter.  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  She has had an insidious onset of dyspnea on exertion over the past 9 to 10 months per her report.  She now has difficulty climbing a flight of stairs.  Her dyspnea has at times progressed to dyspnea at rest, she cites having "spells".  She denies a positional component.  Does not have productive cough, fevers, nausea, vomiting.  She does report that she had intermittent chest pressure, no current chest pain.  Intermittent palpitations.  Recent evaluations by her pulmonologist has resulted in cardiology referral.  In the  month of October 2018 she underwent transthoracic echocardiogram which revealed EF of 70%, right ventricular systolic pressure of .  Underwent high-resolution CT on April 13, 2018 that revealed emphysema as well as enlarged pulmonary artery.  She was started on Spiriva in the outpatient setting.  Not oxygen dependent.  Due to her worsening dyspnea, sought medical evaluation today.   Hospital Course by Problem:   Pulmonary emboli Course: Has had CT of chest without contrast, TTE, and PFTs within the past few months.  Worsening dyspnea over the past year, now with worsening over the past 1-2 weeks with intermittent spells of dyspnea at rest and with minimal ambulation.  On admission, was found to have 2 small segmenta / subsegmental pulmonary emboli. -acute on subacute / chronic dyspnea is likely multifactorial - untreated OSA in setting of chronic anemia superimposed on mild COPD.  It is likely that pulmonary emboli has caused or contributed to acute worsening of dyspnea.  PESI score indicates low risk.   Hemodynamically stable.   No clear provoking factors.  Uncertain if PAH seen on CT is related to chronic thromboembolic disease pulmonary hypertension (CTEPH) or more so from untreated OSA.   -treat with eliquis  GERD -PPI -tums  Gout: not in flare, continue home allopurinol  HTN: resume home meds  Hx of pancytopenia, now with mild leukopenia and anemia: hb appears at baseline (~10), no evidence of active bleed, monitor, is followed by outpatient hematology, Dr. Neil Crouch  COPD, mild: spiriva not helpful  OSA: intolerant to CPAP, will likely need OP sleep study to determine optimal management    Medical Consultants:  Discharge Exam:   Vitals:   05/03/18 0511 05/03/18 1155  BP: 123/68 134/60  Pulse: 78 84  Resp: 18   Temp: 97.6 F (36.4 C) 98.5 F (36.9 C)  SpO2: 97% 95%   Vitals:   05/02/18 1725 05/02/18 2046 05/03/18 0511 05/03/18 1155  BP: (!)  130/57 (!) 135/48 123/68 134/60  Pulse: 72 85 78 84  Resp: 16 16 18    Temp: 98.7 F (37.1 C) 98.7 F (37.1 C) 97.6 F (36.4 C) 98.5 F (36.9 C)  TempSrc: Oral Oral Oral Oral  SpO2: 97% 98% 97% 95%  Weight:      Height:        General exam: Appears calm and comfortable.    The results of significant diagnostics from this hospitalization (including imaging, microbiology, ancillary and laboratory) are listed below for reference.     Procedures and Diagnostic Studies:   Dg Chest 2 View  Result Date: 05/01/2018 CLINICAL DATA:  Shortness of breath EXAM: CHEST - 2 VIEW COMPARISON:  04/21/2016 FINDINGS: The heart size and mediastinal contours are within normal limits. Aortic atherosclerosis. Both lungs are clear. IMPRESSION: No active cardiopulmonary disease. Electronically Signed   By: Jasmine Pang M.D.   On: 05/01/2018 21:52   Ct Angio Chest Pe W And/or Wo Contrast  Result Date: 05/01/2018 CLINICAL DATA:  Chest pain with shortness of breath for 6 weeks. EXAM: CT ANGIOGRAPHY CHEST WITH CONTRAST TECHNIQUE: Multidetector CT imaging of the chest was performed using the standard protocol during bolus administration of intravenous contrast. Multiplanar CT image reconstructions and MIPs were obtained to evaluate the vascular anatomy. CONTRAST:  ISOVUE-370 IOPAMIDOL (ISOVUE-370) INJECTION 76% COMPARISON:  High-resolution chest CT 04/03/2018 FINDINGS: Cardiovascular: Pulmonary arterial opacification is adequate. A very small, thin linear filling defect is questioned in a right lower lobe segmental artery (series 9, image 186). An additional small linear filling defect is questioned in a right lower lobe subsegmental branch with assessment mildly limited by an adjacent vein (series 9, image 202). There are no lobar or more central emboli. Thoracic aortic atherosclerosis is noted without aneurysm. There is an aberrant right subclavian artery. The pulmonary arteries remain enlarged with the main  pulmonary artery measuring 3.4 cm in diameter. Coronary artery atherosclerosis is noted. The heart is normal in size. There is no pericardial effusion. Mediastinum/Nodes: The thyroid is again noted to be heterogeneous with mild asymmetric enlargement of the left lobe as well as a nodule extending inferiorly from the isthmus, partially obscured by streak artifact today and evaluated by ultrasound on 04/18/2018. No enlarged axillary, mediastinal, or hilar lymph nodes are identified. The esophagus is unremarkable. Lungs/Pleura: No pleural effusion or pneumothorax. Mild centrilobular and paraseptal emphysema. Mild respiratory motion artifact with mild dependent atelectasis bilaterally. No mass. Upper Abdomen: No acute abnormality. Musculoskeletal: No acute osseous abnormality or suspicious osseous lesion. Mild thoracic disc and facet degeneration. Review of the MIP images confirms the above findings. IMPRESSION: 1. Two possible tiny right lower lobe segmental/subsegmental emboli. No occlusive or more proximal embolus. 2. Unchanged pulmonary arterial enlargement compatible with pulmonary arterial hypertension. 3. Aortic Atherosclerosis (ICD10-I70.0) and Emphysema (ICD10-J43.9). Electronically Signed   By: Sebastian Ache M.D.   On: 05/01/2018 23:29     Labs:   Basic Metabolic Panel: Recent Labs  Lab 05/01/18 2040 05/01/18 2112 05/02/18 0116  NA 135 137 136  K 4.0 4.0 3.7  CL 103 102 104  CO2 23  --  22  GLUCOSE 106* 103* 104*  BUN 22 23  19  CREATININE 0.96 1.00 0.92  CALCIUM 9.8  --  9.9   GFR Estimated Creatinine Clearance: 52.6 mL/min (by C-G formula based on SCr of 0.92 mg/dL). Liver Function Tests: Recent Labs  Lab 05/01/18 2040  AST 28  ALT 17  ALKPHOS 38  BILITOT 0.4  PROT 7.2  ALBUMIN 4.0   No results for input(s): LIPASE, AMYLASE in the last 168 hours. No results for input(s): AMMONIA in the last 168 hours. Coagulation profile Recent Labs  Lab 05/02/18 0116  INR 1.07     CBC: Recent Labs  Lab 05/01/18 2040 05/01/18 2112 05/02/18 0116  WBC 3.6*  --  3.6*  NEUTROABS 1.4*  --   --   HGB 10.0* 11.2* 9.6*  HCT 33.5* 33.0* 31.5*  MCV 91.3  --  89.7  PLT 154  --  151   Cardiac Enzymes: Recent Labs  Lab 05/02/18 1535  TROPONINI <0.03   BNP: Invalid input(s): POCBNP CBG: No results for input(s): GLUCAP in the last 168 hours. D-Dimer No results for input(s): DDIMER in the last 72 hours. Hgb A1c No results for input(s): HGBA1C in the last 72 hours. Lipid Profile No results for input(s): CHOL, HDL, LDLCALC, TRIG, CHOLHDL, LDLDIRECT in the last 72 hours. Thyroid function studies No results for input(s): TSH, T4TOTAL, T3FREE, THYROIDAB in the last 72 hours.  Invalid input(s): FREET3 Anemia work up Recent Labs    05/02/18 1124  VITAMINB12 571  FERRITIN 32  TIBC 315  IRON 48   Microbiology No results found for this or any previous visit (from the past 240 hour(s)).   Discharge Instructions:   Discharge Instructions    Diet - low sodium heart healthy   Complete by:  As directed    Increase activity slowly   Complete by:  As directed      Allergies as of 05/03/2018      Reactions   Ace Inhibitors Cough   Penicillins Nausea And Vomiting      Medication List    TAKE these medications   albuterol 108 (90 Base) MCG/ACT inhaler Commonly known as:  PROVENTIL HFA;VENTOLIN HFA Inhale 1-2 puffs into the lungs every 6 (six) hours as needed for wheezing or shortness of breath.   allopurinol 100 MG tablet Commonly known as:  ZYLOPRIM Take 100 mg by mouth daily.   calcium carbonate 500 MG chewable tablet Commonly known as:  TUMS - dosed in mg elemental calcium Chew 1 tablet (200 mg of elemental calcium total) by mouth 3 (three) times daily with meals as needed for indigestion or heartburn.   cholecalciferol 25 MCG (1000 UT) tablet Commonly known as:  VITAMIN D3 Take 5,000 Units by mouth daily.   ELIQUIS STARTER PACK 5 MG  Tabs Take as directed on package: start with two-5mg  tablets twice daily for 7 days. On day 8, switch to one-5mg  tablet twice daily.   fluticasone 50 MCG/ACT nasal spray Commonly known as:  FLONASE Place 1 spray into both nostrils daily as needed for allergies or rhinitis.   HAIR VITAMINS PO Take 2 tablets by mouth daily.   latanoprost 0.005 % ophthalmic solution Commonly known as:  XALATAN Place 1 drop into both eyes at bedtime.   multivitamin with minerals Tabs tablet Take 1 tablet by mouth daily.   omega-3 acid ethyl esters 1 g capsule Commonly known as:  LOVAZA Take 1 g by mouth 2 (two) times daily.   pantoprazole 40 MG tablet Commonly known as:  PROTONIX Take 1  tablet (40 mg total) by mouth daily. Start taking on:  05/04/2018   rosuvastatin 10 MG tablet Commonly known as:  CRESTOR Take 10 mg by mouth See admin instructions. Every other day in the evening   simethicone 80 MG chewable tablet Commonly known as:  MYLICON Chew 1 tablet (80 mg total) by mouth every 6 (six) hours as needed for flatulence.   SPIRIVA RESPIMAT IN Inhale 2 puffs into the lungs every morning.   valsartan-hydrochlorothiazide 160-12.5 MG tablet Commonly known as:  DIOVAN-HCT Take 1 tablet by mouth daily.      Follow-up Information    [name], PA-C Follow up in 1 week(s).   Specialty:  Physician Assistant Why:  referral to GI if continues with GERD/heartburn cardiology follow up pulmonology for sleep apnea treatment Contact information: [address] [place] [address] [phone]            Time coordinating discharge: 25 min  Signed:  Joseph Art DO  [facility] 05/03/2018, 2:29 PM

## 2018-05-03 NOTE — Progress Notes (Signed)
ANTICOAGULATION CONSULT NOTE - Follow Up Consult  Pharmacy Consult for Lovenox >> Apixaban Indication: pulmonary embolus  Allergies  Allergen Reactions  . Ace Inhibitors Cough  . Penicillins Nausea And Vomiting    Patient Measurements: Height: 5' 4.5" (163.8 cm) Weight: 191 lb 12.8 oz (87 kg) IBW/kg (Calculated) : 55.85  Vital Signs: Temp: 97.6 F (36.4 C) (11/08 0511) Temp Source: Oral (11/08 0511) BP: 123/68 (11/08 0511) Pulse Rate: 78 (11/08 0511)  Labs: Recent Labs    05/01/18 2040 05/01/18 2112 05/02/18 0116 05/02/18 1535  HGB 10.0* 11.2* 9.6*  --   HCT 33.5* 33.0* 31.5*  --   PLT 154  --  151  --   APTT  --   --  131*  --   LABPROT  --   --  13.8  --   INR  --   --  1.07  --   CREATININE 0.96 1.00 0.92  --   TROPONINI  --   --   --  <0.03    Estimated Creatinine Clearance: 52.6 mL/min (by C-G formula based on SCr of 0.92 mg/dL).   Assessment: 42 YOF who presented on 11/7 with SOB and found to have a small RLL PE. Pharmacy consulted to transition the patient from lovenox to apixaban.   Eliquis discharge kit filled at [facility] and delivered to bedside.    Goal of Therapy:  Appropriate anticoagulation for indication and hepatic/renal function    Plan:  - Start Apixaban 10 mg bid x 7 days followed by 5 mg bid - Educated patient and daughters on new medication - Pharmacy will sign off of consult and continue to monitor peripherally  Thank you for allowing pharmacy to be a part of this patient's care. Anette Guarneri, PharmD  Please check AMION for all [place] numbers 05/03/2018 11:21 AM

## 2018-05-11 ENCOUNTER — Observation Stay (HOSPITAL_COMMUNITY): Payer: Medicare HMO

## 2018-05-11 ENCOUNTER — Other Ambulatory Visit: Payer: Self-pay

## 2018-05-11 ENCOUNTER — Emergency Department (HOSPITAL_COMMUNITY): Payer: Medicare HMO

## 2018-05-11 ENCOUNTER — Inpatient Hospital Stay (HOSPITAL_COMMUNITY)
Admission: EM | Admit: 2018-05-11 | Discharge: 2018-05-17 | DRG: 378 | Disposition: A | Discharge status: Home Health | Payer: Medicare HMO | Attending: Internal Medicine | Admitting: Internal Medicine

## 2018-05-11 ENCOUNTER — Encounter (HOSPITAL_COMMUNITY): Payer: Self-pay | Admitting: Emergency Medicine

## 2018-05-11 DIAGNOSIS — K922 Gastrointestinal hemorrhage, unspecified: Secondary | ICD-10-CM | POA: Diagnosis not present

## 2018-05-11 DIAGNOSIS — D62 Acute posthemorrhagic anemia: Secondary | ICD-10-CM | POA: Diagnosis present

## 2018-05-11 DIAGNOSIS — K219 Gastro-esophageal reflux disease without esophagitis: Secondary | ICD-10-CM | POA: Diagnosis present

## 2018-05-11 DIAGNOSIS — D649 Anemia, unspecified: Secondary | ICD-10-CM | POA: Diagnosis present

## 2018-05-11 DIAGNOSIS — M109 Gout, unspecified: Secondary | ICD-10-CM | POA: Diagnosis present

## 2018-05-11 DIAGNOSIS — N183 Chronic kidney disease, stage 3 unspecified: Secondary | ICD-10-CM | POA: Diagnosis present

## 2018-05-11 DIAGNOSIS — Z79899 Other long term (current) drug therapy: Secondary | ICD-10-CM

## 2018-05-11 DIAGNOSIS — Z87891 Personal history of nicotine dependence: Secondary | ICD-10-CM

## 2018-05-11 DIAGNOSIS — N179 Acute kidney failure, unspecified: Secondary | ICD-10-CM | POA: Diagnosis present

## 2018-05-11 DIAGNOSIS — Z880 Allergy status to penicillin: Secondary | ICD-10-CM

## 2018-05-11 DIAGNOSIS — R0602 Shortness of breath: Secondary | ICD-10-CM | POA: Diagnosis not present

## 2018-05-11 DIAGNOSIS — I2782 Chronic pulmonary embolism: Secondary | ICD-10-CM

## 2018-05-11 DIAGNOSIS — K5710 Diverticulosis of small intestine without perforation or abscess without bleeding: Secondary | ICD-10-CM | POA: Diagnosis present

## 2018-05-11 DIAGNOSIS — Z6833 Body mass index (BMI) 33.0-33.9, adult: Secondary | ICD-10-CM

## 2018-05-11 DIAGNOSIS — G4733 Obstructive sleep apnea (adult) (pediatric): Secondary | ICD-10-CM | POA: Diagnosis present

## 2018-05-11 DIAGNOSIS — I2721 Secondary pulmonary arterial hypertension: Secondary | ICD-10-CM | POA: Diagnosis present

## 2018-05-11 DIAGNOSIS — I10 Essential (primary) hypertension: Secondary | ICD-10-CM | POA: Diagnosis not present

## 2018-05-11 DIAGNOSIS — Z8711 Personal history of peptic ulcer disease: Secondary | ICD-10-CM

## 2018-05-11 DIAGNOSIS — D61818 Other pancytopenia: Secondary | ICD-10-CM | POA: Diagnosis present

## 2018-05-11 DIAGNOSIS — J449 Chronic obstructive pulmonary disease, unspecified: Secondary | ICD-10-CM

## 2018-05-11 DIAGNOSIS — E785 Hyperlipidemia, unspecified: Secondary | ICD-10-CM

## 2018-05-11 DIAGNOSIS — R911 Solitary pulmonary nodule: Secondary | ICD-10-CM | POA: Diagnosis present

## 2018-05-11 DIAGNOSIS — E669 Obesity, unspecified: Secondary | ICD-10-CM | POA: Diagnosis present

## 2018-05-11 DIAGNOSIS — Z86711 Personal history of pulmonary embolism: Secondary | ICD-10-CM

## 2018-05-11 DIAGNOSIS — Z7951 Long term (current) use of inhaled steroids: Secondary | ICD-10-CM

## 2018-05-11 DIAGNOSIS — K31811 Angiodysplasia of stomach and duodenum with bleeding: Principal | ICD-10-CM | POA: Diagnosis present

## 2018-05-11 DIAGNOSIS — I2699 Other pulmonary embolism without acute cor pulmonale: Secondary | ICD-10-CM

## 2018-05-11 DIAGNOSIS — K449 Diaphragmatic hernia without obstruction or gangrene: Secondary | ICD-10-CM | POA: Diagnosis present

## 2018-05-11 DIAGNOSIS — Z7901 Long term (current) use of anticoagulants: Secondary | ICD-10-CM

## 2018-05-11 DIAGNOSIS — Z66 Do not resuscitate: Secondary | ICD-10-CM | POA: Diagnosis present

## 2018-05-11 DIAGNOSIS — E876 Hypokalemia: Secondary | ICD-10-CM | POA: Diagnosis present

## 2018-05-11 DIAGNOSIS — I129 Hypertensive chronic kidney disease with stage 1 through stage 4 chronic kidney disease, or unspecified chronic kidney disease: Secondary | ICD-10-CM | POA: Diagnosis present

## 2018-05-11 HISTORY — DX: Hyperlipidemia, unspecified: E78.5

## 2018-05-11 HISTORY — DX: Other pancytopenia: D61.818

## 2018-05-11 HISTORY — DX: Gout, unspecified: M10.9

## 2018-05-11 HISTORY — DX: Gastro-esophageal reflux disease without esophagitis: K21.9

## 2018-05-11 HISTORY — DX: Essential (primary) hypertension: I10

## 2018-05-11 LAB — CBC WITH DIFFERENTIAL/PLATELET
Abs Immature Granulocytes: 0.03 10*3/uL (ref 0.00–0.07)
BASOS ABS: 0 10*3/uL (ref 0.0–0.1)
BASOS PCT: 1 %
EOS ABS: 0.1 10*3/uL (ref 0.0–0.5)
EOS PCT: 1 %
HCT: 22.4 % — ABNORMAL LOW (ref 36.0–46.0)
HEMOGLOBIN: 6.9 g/dL — AB (ref 12.0–15.0)
Immature Granulocytes: 1 %
Lymphocytes Relative: 27 %
Lymphs Abs: 1.4 10*3/uL (ref 0.7–4.0)
MCH: 28.6 pg (ref 26.0–34.0)
MCHC: 30.8 g/dL (ref 30.0–36.0)
MCV: 92.9 fL (ref 80.0–100.0)
MONO ABS: 0.5 10*3/uL (ref 0.1–1.0)
Monocytes Relative: 10 %
NEUTROS PCT: 60 %
NRBC: 0 % (ref 0.0–0.2)
Neutro Abs: 3.2 10*3/uL (ref 1.7–7.7)
PLATELETS: 123 10*3/uL — AB (ref 150–400)
RBC: 2.41 MIL/uL — AB (ref 3.87–5.11)
RDW: 14 % (ref 11.5–15.5)
WBC: 5.3 10*3/uL (ref 4.0–10.5)

## 2018-05-11 LAB — BASIC METABOLIC PANEL
Anion gap: 8 (ref 5–15)
BUN: 33 mg/dL — ABNORMAL HIGH (ref 8–23)
CO2: 21 mmol/L — AB (ref 22–32)
CREATININE: 1.01 mg/dL — AB (ref 0.44–1.00)
Calcium: 9.4 mg/dL (ref 8.9–10.3)
Chloride: 103 mmol/L (ref 98–111)
GFR calc non Af Amer: 51 mL/min — ABNORMAL LOW (ref >60)
GFR, EST AFRICAN AMERICAN: 59 mL/min — AB (ref >60)
Glucose, Bld: 157 mg/dL — ABNORMAL HIGH (ref 70–99)
Potassium: 3.4 mmol/L — ABNORMAL LOW (ref 3.5–5.1)
Sodium: 132 mmol/L — ABNORMAL LOW (ref 135–145)

## 2018-05-11 LAB — POC OCCULT BLOOD, ED: Fecal Occult Bld: POSITIVE — AB

## 2018-05-11 LAB — I-STAT TROPONIN, ED: TROPONIN I, POC: 0 ng/mL (ref 0.00–0.08)

## 2018-05-11 LAB — BRAIN NATRIURETIC PEPTIDE: B Natriuretic Peptide: 66.7 pg/mL (ref 0.0–100.0)

## 2018-05-11 LAB — PREPARE RBC (CROSSMATCH)

## 2018-05-11 MED ORDER — UMECLIDINIUM-VILANTEROL 62.5-25 MCG/INH IN AEPB
1.0000 | INHALATION_SPRAY | Freq: Every day | RESPIRATORY_TRACT | Status: DC
  Administered 2018-05-12 – 2018-05-17 (×6): 1 via RESPIRATORY_TRACT
  Filled 2018-05-11: qty 14

## 2018-05-11 MED ORDER — ALBUTEROL SULFATE (2.5 MG/3ML) 0.083% IN NEBU: 2.5000 mg | INHALATION_SOLUTION | RESPIRATORY_TRACT | Status: DC | PRN

## 2018-05-11 MED ORDER — ZOLPIDEM TARTRATE 5 MG PO TABS: 5.0000 mg | ORAL_TABLET | Freq: Every evening | ORAL | Status: DC | PRN

## 2018-05-11 MED ORDER — ADULT MULTIVITAMIN W/MINERALS CH
1.0000 | ORAL_TABLET | Freq: Every day | ORAL | Status: DC
  Administered 2018-05-12 – 2018-05-17 (×6): 1 via ORAL
  Filled 2018-05-11 (×6): qty 1

## 2018-05-11 MED ORDER — POTASSIUM CHLORIDE 20 MEQ/15ML (10%) PO SOLN
20.0000 meq | Freq: Once | ORAL | Status: AC
  Administered 2018-05-11: 20 meq via ORAL
  Filled 2018-05-11: qty 15

## 2018-05-11 MED ORDER — SODIUM CHLORIDE 0.9 % IV SOLN
8.0000 mg/h | INTRAVENOUS | Status: AC
  Administered 2018-05-12 – 2018-05-14 (×5): 8 mg/h via INTRAVENOUS
  Filled 2018-05-11 (×9): qty 80

## 2018-05-11 MED ORDER — ACETAMINOPHEN 325 MG PO TABS: 650.0000 mg | ORAL_TABLET | Freq: Four times a day (QID) | ORAL | Status: DC | PRN

## 2018-05-11 MED ORDER — ALUM & MAG HYDROXIDE-SIMETH 200-200-20 MG/5ML PO SUSP
15.0000 mL | Freq: Once | ORAL | Status: AC
  Administered 2018-05-11: 15 mL via ORAL
  Filled 2018-05-11: qty 30

## 2018-05-11 MED ORDER — VITAMIN D 25 MCG (1000 UNIT) PO TABS
5000.0000 [IU] | ORAL_TABLET | Freq: Every day | ORAL | Status: DC
  Administered 2018-05-12 – 2018-05-17 (×6): 5000 [IU] via ORAL
  Filled 2018-05-11 (×6): qty 5

## 2018-05-11 MED ORDER — FLUTICASONE PROPIONATE 50 MCG/ACT NA SUSP
1.0000 | Freq: Every day | NASAL | Status: DC | PRN
  Administered 2018-05-16: 1 via NASAL
  Filled 2018-05-11: qty 16

## 2018-05-11 MED ORDER — SIMETHICONE 80 MG PO CHEW
80.0000 mg | CHEWABLE_TABLET | Freq: Four times a day (QID) | ORAL | Status: DC | PRN
  Administered 2018-05-12 – 2018-05-16 (×4): 80 mg via ORAL
  Filled 2018-05-11 (×4): qty 1

## 2018-05-11 MED ORDER — ACETAMINOPHEN 325 MG PO TABS
650.0000 mg | ORAL_TABLET | Freq: Once | ORAL | Status: AC
  Administered 2018-05-11: 650 mg via ORAL
  Filled 2018-05-11: qty 2

## 2018-05-11 MED ORDER — CALCIUM CARBONATE ANTACID 500 MG PO CHEW: 3.0000 | CHEWABLE_TABLET | Freq: Four times a day (QID) | ORAL | Status: DC | PRN

## 2018-05-11 MED ORDER — SODIUM CHLORIDE 0.9 % IV BOLUS: 500.0000 mL | Freq: Once | INTRAVENOUS | Status: DC

## 2018-05-11 MED ORDER — CALCIUM CARBONATE ANTACID 750 MG PO CHEW: 2.0000 | CHEWABLE_TABLET | Freq: Four times a day (QID) | ORAL | Status: DC | PRN

## 2018-05-11 MED ORDER — OXYCODONE-ACETAMINOPHEN 5-325 MG PO TABS: 1.0000 | ORAL_TABLET | ORAL | Status: DC | PRN

## 2018-05-11 MED ORDER — PANTOPRAZOLE SODIUM 40 MG IV SOLR
40.0000 mg | Freq: Two times a day (BID) | INTRAVENOUS | Status: DC
  Administered 2018-05-15 – 2018-05-16 (×3): 40 mg via INTRAVENOUS
  Filled 2018-05-11 (×3): qty 40

## 2018-05-11 MED ORDER — SODIUM CHLORIDE 0.9% IV SOLUTION
Freq: Once | INTRAVENOUS | Status: AC
  Administered 2018-05-11: 22:00:00 via INTRAVENOUS

## 2018-05-11 MED ORDER — LATANOPROST 0.005 % OP SOLN
1.0000 [drp] | Freq: Every day | OPHTHALMIC | Status: DC
  Administered 2018-05-12 – 2018-05-16 (×5): 1 [drp] via OPHTHALMIC
  Filled 2018-05-11: qty 2.5

## 2018-05-11 MED ORDER — ROSUVASTATIN CALCIUM 10 MG PO TABS
10.0000 mg | ORAL_TABLET | ORAL | Status: DC
  Administered 2018-05-11 – 2018-05-15 (×4): 10 mg via ORAL
  Filled 2018-05-11 (×4): qty 1

## 2018-05-11 MED ORDER — FAMOTIDINE 20 MG PO TABS
20.0000 mg | ORAL_TABLET | Freq: Once | ORAL | Status: AC
  Administered 2018-05-11: 20 mg via ORAL
  Filled 2018-05-11: qty 1

## 2018-05-11 MED ORDER — HYDRALAZINE HCL 20 MG/ML IJ SOLN: 5.0000 mg | INTRAMUSCULAR | Status: DC | PRN

## 2018-05-11 MED ORDER — SODIUM CHLORIDE 0.9 % IV SOLN
INTRAVENOUS | Status: DC
  Administered 2018-05-12 – 2018-05-13 (×2): via INTRAVENOUS

## 2018-05-11 MED ORDER — AMLODIPINE BESYLATE 2.5 MG PO TABS
5.0000 mg | ORAL_TABLET | Freq: Every day | ORAL | Status: DC
  Administered 2018-05-12 – 2018-05-17 (×6): 5 mg via ORAL
  Filled 2018-05-11 (×6): qty 2

## 2018-05-11 MED ORDER — ACETAMINOPHEN 650 MG RE SUPP: 650.0000 mg | Freq: Four times a day (QID) | RECTAL | Status: DC | PRN

## 2018-05-11 MED ORDER — SODIUM CHLORIDE 0.9 % IV SOLN
80.0000 mg | Freq: Once | INTRAVENOUS | Status: AC
  Administered 2018-05-11: 80 mg via INTRAVENOUS
  Filled 2018-05-11: qty 80

## 2018-05-11 MED ORDER — ONDANSETRON HCL 4 MG PO TABS: 4.0000 mg | ORAL_TABLET | Freq: Four times a day (QID) | ORAL | Status: DC | PRN

## 2018-05-11 MED ORDER — DM-GUAIFENESIN ER 30-600 MG PO TB12
1.0000 | ORAL_TABLET | Freq: Two times a day (BID) | ORAL | Status: DC | PRN
  Administered 2018-05-12 – 2018-05-16 (×5): 1 via ORAL
  Filled 2018-05-11 (×5): qty 1

## 2018-05-11 MED ORDER — MORPHINE SULFATE (PF) 2 MG/ML IV SOLN: 1.0000 mg | INTRAVENOUS | Status: DC | PRN

## 2018-05-11 MED ORDER — ALLOPURINOL 100 MG PO TABS
100.0000 mg | ORAL_TABLET | Freq: Every day | ORAL | Status: DC
  Administered 2018-05-12 – 2018-05-17 (×6): 100 mg via ORAL
  Filled 2018-05-11 (×6): qty 1

## 2018-05-11 MED ORDER — ONDANSETRON HCL 4 MG/2ML IJ SOLN: 4.0000 mg | Freq: Four times a day (QID) | INTRAMUSCULAR | Status: DC | PRN

## 2018-05-11 NOTE — ED Triage Notes (Signed)
Pt st's this am when she swallowed she got choked and felt short of breath.  Pt was seen here for same on 11/6.  Pt speaking in full; sentences

## 2018-05-11 NOTE — ED Notes (Signed)
Patient transported to CT 

## 2018-05-11 NOTE — H&P (Signed)
History and Physical    Rebekah Macdonald:[id number] DOB: Apr 25, 1936 DOA: 05/11/2018  Referring MD/NP/PA:   PCP: [name], PA-C   Patient coming from:  The patient is coming from home.  At baseline, pt is independent for most of ADL.        Chief Complaint: Dark stool, generalized weakness, epigastric abdominal pain, shortness of breath  HPI: Rebekah Macdonald is a 82 y.o. female with medical history significant of peptic ulcer disease hypertension, hyperlipidemia, COPD, GERD, pancytopenia, CKD-3, small PE on Eliquis, who presents with dark stool, generalized weakness, epigastric abdominal pain, shortness of breath.  Patient states that she had one large amount of dark stool today.  She also has epigastric abdominal pain, which is constant, 9 out of 10 severity, nonradiating.  Patient denies nausea, vomiting or diarrhea.  She reports shortness of breath and mild cough.  She has chest tightness earlier, which has resolved.  Denies any chest pain.  No fever or chills.  Denies symptoms of UTI or unilateral weakness. Last dose of Eliquis was this morning. Patient states that she had negative remote colonoscopy and positive EGD with bleeding ulcer disease in the past.  ED Course: pt was found to have positive FOBT, hemoglobin dropped from 9.6 on 05/02/18 to 6.9.  Potassium 3.4, stable renal function, temperature normal, no tachycardia, no tachypnea, oxygen saturation 100% on room air.  CT abdomen/pelvis showed no acute findings in the abdomen or pelvis, but showed a 4 mm right lower lobe pulmonary nodule. Chest x-ray negative.  Patient is placed on telemetry bed for observation  Review of Systems:   General: no fevers, chills, no body weight gain, has poor appetite, has fatigue HEENT: no blurry vision, hearing changes or sore throat Respiratory: has dyspnea, coughing, no wheezing CV: no chest pain, no palpitations GI: no nausea, vomiting, has abdominal pain, no diarrhea,  constipation. Has dark stool GU: no dysuria, burning on urination, increased urinary frequency, hematuria  Ext: no leg edema Neuro: no unilateral weakness, numbness, or tingling, no vision change or hearing loss Skin: no rash, no skin tear. MSK: No muscle spasm, no deformity, no limitation of range of movement in spin Heme: No easy bruising.  Travel history: No recent long distant travel.  Allergy:  Allergies  Allergen Reactions  . Ace Inhibitors Cough  . Penicillins Other (See Comments)    Possibly swelling and rash at about 82 years old Has patient had a PCN reaction causing immediate rash, facial/tongue/throat swelling, SOB or lightheadedness with hypotension: Unknown Has patient had a PCN reaction causing severe rash involving mucus membranes or skin necrosis: Unknown Has patient had a PCN reaction that required hospitalization: No Has patient had a PCN reaction occurring within the last 10 years: No If all of the above answers are "NO", then may proceed with Cephalosporin use.    Past Medical History:  Diagnosis Date  . COPD, mild (HCC) 03/26/2018  . Essential hypertension   . GERD (gastroesophageal reflux disease)   . Gout   . HLD (hyperlipidemia)   . Mitral valve annular calcification 03/26/2018  . Pancytopenia [facility])     Past Surgical History:  Procedure Laterality Date  . ABDOMINAL HYSTERECTOMY  1979    Social History:  reports that she quit smoking about 30 years ago. She has never used smokeless tobacco. She reports that she does not drink alcohol or use drugs.  Family History:  Family History  Problem Relation Age of Onset  . Prostate cancer Father   .  Cancer - Colon Brother      Prior to Admission medications   Medication Sig Start Date End Date Taking? Authorizing Provider  albuterol (PROVENTIL HFA;VENTOLIN HFA) 108 (90 Base) MCG/ACT inhaler Inhale 1-2 puffs into the lungs every 6 (six) hours as needed for wheezing or shortness of breath.    [provider]  allopurinol (ZYLOPRIM) 100 MG tablet Take 100 mg by mouth daily.    [provider]  calcium carbonate (TUMS - DOSED IN MG ELEMENTAL CALCIUM) 500 MG chewable tablet Chew 1 tablet (200 mg of elemental calcium total) by mouth 3 (three) times daily with meals as needed for indigestion or heartburn. 05/03/18   Joseph Art, DO  cholecalciferol (VITAMIN D3) 25 MCG (1000 UT) tablet Take 5,000 Units by mouth daily.    [provider]  ELIQUIS STARTER PACK (ELIQUIS STARTER PACK) 5 MG TABS Take as directed on package: start with two-5mg  tablets twice daily for 7 days. On day 8, switch to one-5mg  tablet twice daily. 05/03/18   Joseph Art, DO  fluticasone (FLONASE) 50 MCG/ACT nasal spray Place 1 spray into both nostrils daily as needed for allergies or rhinitis.    [provider]  latanoprost (XALATAN) 0.005 % ophthalmic solution Place 1 drop into both eyes at bedtime.    [provider]  Multiple Vitamin (MULTIVITAMIN WITH MINERALS) TABS tablet Take 1 tablet by mouth daily.    [provider]  Multiple Vitamins-Minerals (HAIR VITAMINS PO) Take 2 tablets by mouth daily.    [provider]  omega-3 acid ethyl esters (LOVAZA) 1 g capsule Take 1 g by mouth 2 (two) times daily.    [provider]  pantoprazole (PROTONIX) 40 MG tablet Take 1 tablet (40 mg total) by mouth daily. 05/04/18   Joseph Art, DO  rosuvastatin (CRESTOR) 10 MG tablet Take 10 mg by mouth See admin instructions. Every other day in the evening    [provider]  simethicone (MYLICON) 80 MG chewable tablet Chew 1 tablet (80 mg total) by mouth every 6 (six) hours as needed for flatulence. 05/03/18   Joseph Art, DO  Tiotropium Bromide Monohydrate (SPIRIVA RESPIMAT IN) Inhale 2 puffs into the lungs every morning.    [provider]  valsartan-hydrochlorothiazide (DIOVAN-HCT) 160-12.5 MG tablet Take 1 tablet by mouth daily.    [provider]    Physical Exam: Vitals:   05/12/18 0046 05/12/18 0252 05/12/18 0456 05/12/18 0458  BP: (!) 121/57 (!) 128/56  122/63  Pulse:  79  77  Resp:  12  18  Temp:  98.9 F (37.2 C)  98.5 F (36.9 C)  TempSrc:  Oral  Oral  SpO2:  98%  99%  Weight:   89.1 kg   Height:       General: Not in acute distress. Pale looking. HEENT:       Eyes: PERRL, EOMI, no scleral icterus.       ENT: No discharge from the ears and nose, no pharynx injection, no tonsillar enlargement.        Neck: No JVD, no bruit, no mass felt. Heme: No neck lymph node enlargement. Cardiac: S1/S2, RRR, No murmurs, No gallops or rubs. Respiratory: No rales, wheezing, rhonchi or rubs. GI: Soft, nondistended, has tenderness in left epigastric area, no rebound pain, no organomegaly, BS present. GU: No hematuria Ext: No pitting leg edema bilaterally. 2+DP/PT pulse bilaterally. Musculoskeletal: No joint deformities, No joint redness or warmth, no limitation  of ROM in spin. Skin: No rashes.  Neuro: Alert, oriented X3, cranial nerves II-XII grossly intact, moves all extremities normally. Psych: Patient is not psychotic, no suicidal or hemocidal ideation.  Labs on Admission: I have personally reviewed following labs and imaging studies  CBC: Recent Labs  Lab 05/11/18 1744  WBC 5.3  NEUTROABS 3.2  HGB 6.9*  HCT 22.4*  MCV 92.9  PLT 123*   Basic Metabolic Panel: Recent Labs  Lab 05/11/18 1744  NA 132*  K 3.4*  CL 103  CO2 21*  GLUCOSE 157*  BUN 33*  CREATININE 1.01*  CALCIUM 9.4   GFR: Estimated Creatinine Clearance: 48 mL/min (A) (by C-G formula based on SCr of 1.01 mg/dL (H)). Liver Function Tests: No results for input(s): AST, ALT, ALKPHOS, BILITOT, PROT, ALBUMIN in the last 168 hours. No results for input(s): LIPASE, AMYLASE in the last 168 hours. No results for input(s): AMMONIA in the last 168 hours. Coagulation Profile: No results for input(s): INR, PROTIME in the last 168  hours. Cardiac Enzymes: No results for input(s): CKTOTAL, CKMB, CKMBINDEX, TROPONINI in the last 168 hours. BNP (last 3 results) No results for input(s): PROBNP in the last 8760 hours. HbA1C: No results for input(s): HGBA1C in the last 72 hours. CBG: No results for input(s): GLUCAP in the last 168 hours. Lipid Profile: No results for input(s): CHOL, HDL, LDLCALC, TRIG, CHOLHDL, LDLDIRECT in the last 72 hours. Thyroid Function Tests: No results for input(s): TSH, T4TOTAL, FREET4, T3FREE, THYROIDAB in the last 72 hours. Anemia Panel: No results for input(s): VITAMINB12, FOLATE, FERRITIN, TIBC, IRON, RETICCTPCT in the last 72 hours. Urine analysis: No results found for: COLORURINE, APPEARANCEUR, LABSPEC, PHURINE, GLUCOSEU, HGBUR, BILIRUBINUR, KETONESUR, PROTEINUR, UROBILINOGEN, NITRITE, LEUKOCYTESUR Sepsis Labs: @LABRCNTIP (procalcitonin:4,lacticidven:4) )No results found for this or any previous visit (from the past 240 hour(s)).   Radiological Exams on Admission: Ct Abdomen Pelvis Wo Contrast  Result Date: 05/11/2018 CLINICAL DATA:  Abdominal pain. EXAM: CT ABDOMEN AND PELVIS WITHOUT CONTRAST TECHNIQUE: Multidetector CT imaging of the abdomen and pelvis was performed following the standard protocol without IV contrast. COMPARISON:  None. FINDINGS: Lower chest: Coronary artery calcification is evident. Atherosclerotic calcification is noted in the wall of the thoracic aorta. 4 mm posterior right lower lobe pulmonary nodule evident. Hepatobiliary: No focal abnormality in the liver on this study without intravenous contrast. There is no evidence for gallstones, gallbladder wall thickening, or pericholecystic fluid. No intrahepatic or extrahepatic biliary dilation. Pancreas: No focal mass lesion. No dilatation of the main duct. No intraparenchymal cyst. No peripancreatic edema. Spleen: No splenomegaly. No focal mass lesion. Adrenals/Urinary Tract: No adrenal nodule or mass. Kidneys unremarkable.  No evidence for hydroureter. The urinary bladder appears normal for the degree of distention. Stomach/Bowel: Stomach is nondistended. No gastric wall thickening. No evidence of outlet obstruction. Duodenum is normally positioned as is the ligament of Treitz. No small bowel wall thickening. No small bowel dilatation. The terminal ileum is normal. The appendix is normal. No gross colonic mass. No colonic wall thickening. Vascular/Lymphatic: There is abdominal aortic atherosclerosis without aneurysm. There is no gastrohepatic or hepatoduodenal ligament lymphadenopathy. No intraperitoneal or retroperitoneal lymphadenopathy. No pelvic sidewall lymphadenopathy. Reproductive: Uterus surgically absent.  There is no adnexal mass. Other: No intraperitoneal free fluid. Musculoskeletal: Small bilateral groin hernias contain only fat the No worrisome lytic or sclerotic osseous abnormality. IMPRESSION: 1. No acute findings in the abdomen or pelvis. 2. 4 mm right lower lobe pulmonary nodule. No follow-up needed if patient is low-risk. Non-contrast  chest CT can be considered in 12 months if patient is high-risk. This recommendation follows the consensus statement: Guidelines for Management of Incidental Pulmonary Nodules Detected on CT Images: From the Fleischner Society 2017; Radiology 2017; 284:228-243. 3.  Aortic Atherosclerois (ICD10-170.0) Electronically Signed   By: Kennith CenterEric  Mansell M.D.   On: 05/11/2018 20:52   Dg Chest 2 View  Result Date: 05/11/2018 CLINICAL DATA:  Chest pain and shortness of breath. EXAM: CHEST - 2 VIEW COMPARISON:  05/01/2018 FINDINGS: The lungs are clear without focal pneumonia, edema, pneumothorax or pleural effusion. The cardiopericardial silhouette is within normal limits for size. The visualized bony structures of the thorax are intact. Telemetry leads overlie the chest. IMPRESSION: No active cardiopulmonary disease. Electronically Signed   By: Kennith CenterEric  Mansell M.D.   On: 05/11/2018 18:46      EKG: Independently reviewed.  Sinus rhythm, QTC 478, low voltage, occasional PVC, nonspecific T wave change.   Assessment/Plan Principal Problem:   GIB (gastrointestinal bleeding) Active Problems:   Pulmonary embolism (HCC)   COPD, mild (HCC)   HLD (hyperlipidemia)   Essential hypertension   Gout   Pancytopenia (HCC)   Symptomatic anemia   Hypokalemia   CKD (chronic kidney disease), stage III (HCC)   Pulmonary nodule   GIB (gastrointestinal bleeding) and Symptomatic anemia: Hemoglobin dropped from 9.6 on 05/02/18 to 6.9.  Currently hemodynamically stable.  Possibly due to upper GI bleeding, secondary to peptic ulcer disease.  Patient has abdominal pain, but CT abdomen/pelvis is negative for acute abdominal abnormalities.  - will place in tele bed for obs - transfuse 2 units of blood now - NPO for possible EGD - IVF: 500 cc NS bolus - Start IV pantoprazole gtt - Zofran IV for nausea - Avoid NSAIDs and SQ heparin - Maintain IV access (2 large bore IVs if possible). - Monitor closely and follow q6h cbc, transfuse as necessary, if Hgb<7.0 - LaB: INR, PTT and type screen - please call GI in AM  Pulmonary embolism (HCC): CTA on 05/01/18 showed two possible tiny right lower lobe segmental/subsegmental emboli and no occlusive or more proximal embolus. -will hold Eliquis now -get LE venous doppler. If pt has significant lower extremity DVT, may consider IVC filter placement.  HLD; -Crestor  COPD, mild (HCC): Stable. -Bronchodilators  HTN:  -hold Diovan-HCTZ  -start amlodipine 5 mg daily -IV hydralazine prn  Gout: -continue home allopurinol  Pancytopenia (HCC): pt has hx of pancytopenia.  Hemoglobin has dropped further due to GI bleeding today. -Transfuse 2 unit of blood. -Patient is followed up with hematologist, Dr. Neil Crouchrane  Hypokalemia: K= 3.4 on admission. - Repleted  CKD (chronic kidney disease), stage III (HCC): stable.  Baseline creatinine, 0.9-1.0. her  creatinine is 1.01, BUN 33. - Follow-up with BMP  Pulmonary nodule: -f/u with PCP   DVT ppx: None (due to GI bleeding, cannot use heparin and Lovenox; will need to rule out DVT due to PE, so currently cannot SCD) .  Code Status: Partial code (I discussed with the patient in the presence of her 4 daughters, and explained the meaning of CODE STATUS, patient wants to be partial code, OK for CPR, but no intubation). Family Communication:  Yes, patient's 4 daughters at bed side Disposition Plan:  Anticipate discharge back to previous home environment Consults called:  none Admission status: Obs / tele   Date of Service 05/12/2018    Lorretta HarpXilin Patrina Andreas [facility] Pager [phone]  If 7PM-7AM, please contact night-coverage [email or web address] Password [email or web address] 05/12/2018, 5:16 AM

## 2018-05-11 NOTE — ED Provider Notes (Signed)
[facility] EMERGENCY DEPARTMENT Provider Note   CSN: [id number] Arrival date & time: 05/11/18  1730     History   Chief Complaint Chief Complaint  Patient presents with  . Shortness of Breath    HPI Rebekah Macdonald is a 82 y.o. female.  HPI Patient is an 82 year old female with past medical history of COPD, hypertension, untreated OSA, and recent admission secondary to possible subsegmental PE currently on Eliquis who presents to the emergency department for evaluation of shortness of breath.  Patient reports that she has been intermittently short of breath for approximately the last 9 months to a year.  States that today she continued to have shortness of breath that is similar to what she has been experiencing for several months.  Only change that occurred today was that she had an episode of a choking-like episode for which EMS was called to her home.  At that time she refused transport, however after going to her nephew's birthday party states that at rest she continued to feel short of breath and as a result came to the emergency department for further evaluation.  She denies any recent sick symptoms including no cough or fevers.  States that she has not experienced any chest pain today.  She does endorse intermittent chest tightness over the past few months and is currently awaiting evaluation as an outpatient by cardiology.  She endorses compliance with her home Eliquis.  Denies any lower extremity edema.  Remaining review of systems as below.  Past Medical History:  Diagnosis Date  . COPD, mild (HCC) 03/26/2018  . Essential hypertension   . GERD (gastroesophageal reflux disease)   . Gout   . HLD (hyperlipidemia)   . Mitral valve annular calcification 03/26/2018  . Pancytopenia [facility])     Patient Active Problem List   Diagnosis Date Noted  . GIB (gastrointestinal bleeding) 05/11/2018  . Symptomatic anemia 05/11/2018  . Hypokalemia 05/11/2018  . CKD  (chronic kidney disease), stage III (HCC) 05/11/2018  . HLD (hyperlipidemia)   . Essential hypertension   . Gout   . Pancytopenia (HCC)   . Pulmonary embolism (HCC) 05/02/2018  . COPD, mild (HCC) 03/26/2018    Past Surgical History:  Procedure Laterality Date  . ABDOMINAL HYSTERECTOMY  1979     OB History   None      Home Medications    Prior to Admission medications   Medication Sig Start Date End Date Taking? Authorizing Provider  acetaminophen (TYLENOL) 650 MG CR tablet Take 1,300 mg by mouth daily.   Yes [provider]  albuterol (PROVENTIL HFA;VENTOLIN HFA) 108 (90 Base) MCG/ACT inhaler Inhale 1-2 puffs into the lungs every 6 (six) hours as needed for wheezing or shortness of breath.   Yes [provider]  allopurinol (ZYLOPRIM) 100 MG tablet Take 100 mg by mouth daily.   Yes [provider]  calcium carbonate (TUMS EX) 750 MG chewable tablet Chew 2 tablets by mouth 4 (four) times daily as needed for heartburn.   Yes [provider]  Cholecalciferol (VITAMIN D-3) 125 MCG (5000 UT) TABS Take 5,000 Units by mouth daily.   Yes [provider]  ELIQUIS STARTER PACK (ELIQUIS STARTER PACK) 5 MG TABS Take as directed on package: start with two-5mg  tablets twice daily for 7 days. On day 8, switch to one-5mg  tablet twice daily. Patient taking differently: Take 5-10 mg by mouth See admin instructions. Take as directed on package: start with two-5mg  tablets twice  daily for 7 days. On day 8, switch to one-5mg  tablet twice daily. 05/03/18  Yes Vann, Jessica U, DO  fluticasone (FLONASE) 50 MCG/ACT nasal spray Place 1 spray into both nostrils daily as needed for allergies or rhinitis.   Yes [provider]  latanoprost (XALATAN) 0.005 % ophthalmic solution Place 1 drop into both eyes at bedtime.   Yes [provider]  Multiple Vitamin (MULTIVITAMIN WITH MINERALS) TABS tablet Take 1 tablet by mouth daily.   Yes [provider]  Multiple Vitamins-Minerals (HAIR VITAMINS PO) Take 2 tablets by mouth daily. Hairfinity   Yes [provider]  Omega-3 Fatty Acids (FISH OIL) 1200 MG CAPS Take 1,200 mg by mouth 2 (two) times daily.   Yes [provider]  pantoprazole (PROTONIX) 40 MG tablet Take 1 tablet (40 mg total) by mouth daily. 05/04/18  Yes Vann, Jessica U, DO  rosuvastatin (CRESTOR) 10 MG tablet Take 10 mg by mouth See admin instructions. Take one tablet (10 mg) by mouth on even numbered nights   Yes [provider]  simethicone (GAS-X EXTRA STRENGTH) 125 MG chewable tablet Chew 250 mg by mouth every 6 (six) hours as needed for flatulence.   Yes [provider]  umeclidinium-vilanterol (ANORO ELLIPTA) 62.5-25 MCG/INH AEPB Inhale 1 puff into the lungs daily. 05/09/18 05/23/18 Yes [provider]  valsartan-hydrochlorothiazide (DIOVAN-HCT) 160-12.5 MG tablet Take 1 tablet by mouth daily.   Yes [provider]  calcium carbonate (TUMS - DOSED IN MG ELEMENTAL CALCIUM) 500 MG chewable tablet Chew 1 tablet (200 mg of elemental calcium total) by mouth 3 (three) times daily with meals as needed for indigestion or heartburn. Patient not taking: Reported on 05/11/2018 05/03/18   Joseph Art, DO  simethicone (MYLICON) 80 MG chewable tablet Chew 1 tablet (80 mg total) by mouth every 6 (six) hours as needed for flatulence. Patient not taking: Reported on 05/11/2018 05/03/18   Joseph Art, DO    Family History Family History  Problem Relation Age of Onset  . Prostate cancer Father   . Cancer - Colon Brother     Social History Social History   Tobacco Use  . Smoking status: Former Smoker    Last attempt to quit: 1989    Years since quitting: 30.8  . Smokeless tobacco: Never Used  Substance Use Topics  . Alcohol use: Never    Frequency: Never  . Drug use: Never     Allergies   Ace inhibitors and Penicillins   Review of Systems Review of Systems    Constitutional: Negative for chills and fever.  HENT: Negative for ear pain and sore throat.   Eyes: Negative for pain and visual disturbance.  Respiratory: Positive for choking (earlier today, resolved now. ) and shortness of breath. Negative for cough.   Cardiovascular: Negative for chest pain and palpitations.  Gastrointestinal: Positive for abdominal pain (intermittent, epigastric). Negative for vomiting.       Reports melanotic stools.   Genitourinary: Negative for dysuria and hematuria.  Musculoskeletal: Negative for arthralgias and back pain.  Skin: Negative for color change and rash.  Neurological: Negative for seizures and syncope.  All other systems reviewed and are negative.    Physical Exam Updated Vital Signs BP (!) 151/59   Pulse 80   Temp 98.3 F (36.8 C) (Oral)   Resp 17   Ht 5\' 4"  (1.626 m)   Wt 89.8 kg   SpO2 100%   BMI 33.99 kg/m  Physical Exam  Constitutional: She appears well-developed and well-nourished. No distress.  HENT:  Head: Normocephalic and atraumatic.  Eyes: Conjunctivae are normal.  Neck: Neck supple.  Cardiovascular: Normal rate and regular rhythm.  Pulmonary/Chest: Effort normal. No respiratory distress. She has decreased breath sounds (in all fields. ).  Abdominal: Soft. There is tenderness (Epigastric).  Genitourinary: Rectal exam shows guaiac positive stool.  Genitourinary Comments: Melanotic stool present on rectal exam.  Musculoskeletal: She exhibits no edema.       Right lower leg: She exhibits no edema.       Left lower leg: She exhibits no edema.  Neurological: She is alert.  Skin: Skin is warm and dry.  Psychiatric: She has a normal mood and affect.  Nursing note and vitals reviewed.    ED Treatments / Results  Labs (all labs ordered are listed, but only abnormal results are displayed) Labs Reviewed  BASIC METABOLIC PANEL - Abnormal; Notable for the following components:      Result Value   Sodium 132 (*)     Potassium 3.4 (*)    CO2 21 (*)    Glucose, Bld 157 (*)    BUN 33 (*)    Creatinine, Ser 1.01 (*)    GFR calc non Af Amer 51 (*)    GFR calc Af Amer 59 (*)    All other components within normal limits  CBC WITH DIFFERENTIAL/PLATELET - Abnormal; Notable for the following components:   RBC 2.41 (*)    Hemoglobin 6.9 (*)    HCT 22.4 (*)    Platelets 123 (*)    All other components within normal limits  POC OCCULT BLOOD, ED - Abnormal; Notable for the following components:   Fecal Occult Bld POSITIVE (*)    All other components within normal limits  BRAIN NATRIURETIC PEPTIDE  CBC  CBC  CBC  BASIC METABOLIC PANEL  I-STAT TROPONIN, ED  TYPE AND SCREEN  ABO/RH  PREPARE RBC (CROSSMATCH)    EKG EKG Interpretation  Date/Time:  Saturday May 11 2018 17:40:28 EST Ventricular Rate:  91 PR Interval:    QRS Duration: 98 QT Interval:  388 QTC Calculation: 478 R Axis:   85 Text Interpretation:  Sinus rhythm Ventricular premature complex Borderline right axis deviation Borderline T abnormalities, anterior leads Nonspecific ST abnormality Confirmed by Cathren Laine ([id number]) on 05/11/2018 5:43:21 PM   Radiology Dg Chest 2 View  Result Date: 05/11/2018 CLINICAL DATA:  Chest pain and shortness of breath. EXAM: CHEST - 2 VIEW COMPARISON:  05/01/2018 FINDINGS: The lungs are clear without focal pneumonia, edema, pneumothorax or pleural effusion. The cardiopericardial silhouette is within normal limits for size. The visualized bony structures of the thorax are intact. Telemetry leads overlie the chest. IMPRESSION: No active cardiopulmonary disease. Electronically Signed   By: Kennith Center M.D.   On: 05/11/2018 18:46    Procedures Procedures (including critical care time)  Medications Ordered in ED Medications  0.9 %  sodium chloride infusion (Manually program via Guardrails IV Fluids) (has no administration in time range)  pantoprazole (PROTONIX) 80 mg in sodium chloride 0.9 % 100 mL  IVPB (80 mg Intravenous New Bag/Given 05/11/18 2003)  allopurinol (ZYLOPRIM) tablet 100 mg (has no administration in time range)  rosuvastatin (CRESTOR) tablet 10 mg (has no administration in time range)  calcium carbonate (TUMS EX) chewable tablet 1,500 mg (has no administration in time range)  simethicone (MYLICON) chewable tablet 80 mg (has no administration in time range)  Vitamin D-3 TABS  5,000 Units (has no administration in time range)  multivitamin with minerals tablet 1 tablet (has no administration in time range)  fluticasone (FLONASE) 50 MCG/ACT nasal spray 1 spray (has no administration in time range)  umeclidinium-vilanterol (ANORO ELLIPTA) 62.5-25 MCG/INH 1 puff (1 puff Inhalation Not Given 05/11/18 2005)  latanoprost (XALATAN) 0.005 % ophthalmic solution 1 drop (has no administration in time range)  pantoprazole (PROTONIX) 80 mg in sodium chloride 0.9 % 250 mL (0.32 mg/mL) infusion (8 mg/hr Intravenous Not Given 05/11/18 2010)  pantoprazole (PROTONIX) injection 40 mg (has no administration in time range)  potassium chloride 20 MEQ/15ML (10%) solution 20 mEq (has no administration in time range)  amLODipine (NORVASC) tablet 5 mg (has no administration in time range)  hydrALAZINE (APRESOLINE) injection 5 mg (has no administration in time range)  albuterol (PROVENTIL) (2.5 MG/3ML) 0.083% nebulizer solution 2.5 mg (has no administration in time range)  sodium chloride 0.9 % bolus 500 mL (has no administration in time range)  0.9 %  sodium chloride infusion (has no administration in time range)  acetaminophen (TYLENOL) tablet 650 mg (has no administration in time range)    Or  acetaminophen (TYLENOL) suppository 650 mg (has no administration in time range)  ondansetron (ZOFRAN) tablet 4 mg (has no administration in time range)    Or  ondansetron (ZOFRAN) injection 4 mg (has no administration in time range)  zolpidem (AMBIEN) tablet 5 mg (has no administration in time range)    famotidine (PEPCID) tablet 20 mg (20 mg Oral Given 05/11/18 1811)  alum & mag hydroxide-simeth (MAALOX/MYLANTA) 200-200-20 MG/5ML suspension 15 mL (15 mLs Oral Given 05/11/18 1811)  acetaminophen (TYLENOL) tablet 650 mg (650 mg Oral Given 05/11/18 1919)     Initial Impression / Assessment and Plan / ED Course  I have reviewed the triage vital signs and the nursing notes.  Pertinent labs & imaging results that were available during my care of the patient were reviewed by me and considered in my medical decision making (see chart for details).     Patient is a 82 year old female with a past medical history as detailed above who presents the emergency department for evaluation of shortness of breath.  Patient's symptoms have been ongoing for several months, however she did have an episode of choking earlier today for which EMS was called out.  She has completely recovered from that episode, however was short of breath at her nephew's birthday party and as result came to the emergency department for further evaluation.  Upon arrival patient is speaking in full sentences, no respiratory distress, and satting at 100% on room air.  She denies any chest pain today or for the past few days.  She endorses that she has been very compliant with her home Eliquis.  Secondary to patient's arrival complaint multiple laboratory and imaging studies were obtained.  Patient's chest x-ray shows no evidence of acute cardiac or pulmonary abnormality.  Her labs do show a significant anemia with a hemoglobin of 6.9.  As result of this finding, further history was obtained and patient does report melanotic stools.  Rectal exam performed and does show melanotic stool that was positive for occult blood on testing.  As result patient shortness of breath and anemia type and screen was obtained and patient was given 1 unit of packed red blood cells while in the emergency department after obtaining consent.  She was also given IV  Protonix.  The hospital service was then consulted for admission and the patient  will be accepted to their service for further evaluation and care.  No further acute events while under my care.  The care of this patient was discussed with my attending physician Dr. Denton LankSteinl, who voices agreement with work-up and ED disposition.  Final Clinical Impressions(s) / ED Diagnoses   Final diagnoses:  SOB (shortness of breath)  Symptomatic anemia    ED Discharge Orders    None       Zelda Reames, Winfield Rastichard T, MD 05/11/18 [id number]    Cathren LaineSteinl, Kevin, MD 05/12/18 [phone]

## 2018-05-12 ENCOUNTER — Other Ambulatory Visit: Payer: Self-pay

## 2018-05-12 ENCOUNTER — Observation Stay (HOSPITAL_COMMUNITY): Payer: Medicare HMO

## 2018-05-12 DIAGNOSIS — D62 Acute posthemorrhagic anemia: Secondary | ICD-10-CM | POA: Diagnosis not present

## 2018-05-12 DIAGNOSIS — N179 Acute kidney failure, unspecified: Secondary | ICD-10-CM

## 2018-05-12 DIAGNOSIS — R911 Solitary pulmonary nodule: Secondary | ICD-10-CM | POA: Diagnosis present

## 2018-05-12 DIAGNOSIS — E869 Volume depletion, unspecified: Secondary | ICD-10-CM

## 2018-05-12 DIAGNOSIS — K922 Gastrointestinal hemorrhage, unspecified: Secondary | ICD-10-CM | POA: Diagnosis not present

## 2018-05-12 LAB — CBC
HEMATOCRIT: 24.2 % — AB (ref 36.0–46.0)
HEMATOCRIT: 26.2 % — AB (ref 36.0–46.0)
HEMOGLOBIN: 7.9 g/dL — AB (ref 12.0–15.0)
HEMOGLOBIN: 8.3 g/dL — AB (ref 12.0–15.0)
MCH: 28.2 pg (ref 26.0–34.0)
MCH: 27.9 pg (ref 26.0–34.0)
MCHC: 32.6 g/dL (ref 30.0–36.0)
MCHC: 31.7 g/dL (ref 30.0–36.0)
MCV: 86.4 fL (ref 80.0–100.0)
MCV: 87.9 fL (ref 80.0–100.0)
NRBC: 0 % (ref 0.0–0.2)
Platelets: 101 10*3/uL — ABNORMAL LOW (ref 150–400)
Platelets: 107 10*3/uL — ABNORMAL LOW (ref 150–400)
RBC: 2.8 MIL/uL — ABNORMAL LOW (ref 3.87–5.11)
RBC: 2.98 MIL/uL — ABNORMAL LOW (ref 3.87–5.11)
RDW: 15.3 % (ref 11.5–15.5)
RDW: 15.9 % — ABNORMAL HIGH (ref 11.5–15.5)
WBC: 3.8 10*3/uL — ABNORMAL LOW (ref 4.0–10.5)
WBC: 4.6 10*3/uL (ref 4.0–10.5)
nRBC: 0 % (ref 0.0–0.2)

## 2018-05-12 LAB — BASIC METABOLIC PANEL
Anion gap: 7 (ref 5–15)
BUN: 29 mg/dL — AB (ref 8–23)
CO2: 23 mmol/L (ref 22–32)
Calcium: 9.3 mg/dL (ref 8.9–10.3)
Chloride: 106 mmol/L (ref 98–111)
Creatinine, Ser: 1.01 mg/dL — ABNORMAL HIGH (ref 0.44–1.00)
GFR calc Af Amer: 59 mL/min — ABNORMAL LOW (ref >60)
GFR calc non Af Amer: 51 mL/min — ABNORMAL LOW (ref >60)
GLUCOSE: 95 mg/dL (ref 70–99)
Potassium: 4 mmol/L (ref 3.5–5.1)
Sodium: 136 mmol/L (ref 135–145)

## 2018-05-12 LAB — PROTIME-INR
INR: 1.11
Prothrombin Time: 14.3 seconds (ref 11.4–15.2)

## 2018-05-12 LAB — APTT: APTT: 28 s (ref 24–36)

## 2018-05-12 LAB — ABO/RH: ABO/RH(D): AB POS

## 2018-05-12 MED ORDER — SODIUM CHLORIDE 0.9 % IV SOLN: INTRAVENOUS | Status: DC

## 2018-05-12 NOTE — Progress Notes (Signed)
PROGRESS NOTE    Rebekah Macdonald  ZOX:[id number] DOB: [birth year] DOA: 05/11/2018 PCP: [name], PA-C  Outpatient Specialists:   Brief Narrative:  Patient is an 82 year old African-American female, past medical history significant for peptic ulcer disease, GERD, pulmonary embolism on Eliquis, GERD, chronic gastritis, hypertension, OSA, COPD, pancytopenia, chronic anemia and pulmonary arterial hypertension.  Patient presented with abdominal pain and hematochezia.  Significant drop in patient's hemoglobin from 9.6 g/dL to 6.9 g/dL was noted.  Patient has been admitted for further work-up and assessment of likely upper GI bleed.  Patient has been transfused with 1 unit of packed red blood cells.  Hemoglobin this morning is 7.9 g/dL.  No further bleeding reported.  Monitor H/H every 8 hours.  I have consulted the GI team, Dr. Bosie Clos.  Likely EGD in the morning.   Assessment & Plan:   Principal Problem:   GIB (gastrointestinal bleeding) Active Problems:   Pulmonary embolism (HCC)   COPD, mild (HCC)   HLD (hyperlipidemia)   Essential hypertension   Gout   Pancytopenia (HCC)   Symptomatic anemia   Hypokalemia   CKD (chronic kidney disease), stage III (HCC)   Pulmonary nodule   GIB (gastrointestinal bleeding), likely upper GI bleed:  Hemoglobin dropped from 9.6 on 05/02/18 to 6.9.   Likely due to upper GI bleed. Prior history of peptic ulcer disease. CT abdomen and pelvis is negative for acute abdominal abnormalities. Currently hemodynamically stable.   Patient has been transfused with 1 unit of packed red blood cells. Current hemoglobin is 7.9 g/dL Monitor H/H every 8 hours GI team consulted For EGD in a.m.  Acute blood loss loss anemia:  Likely secondary to GI bleed See above.  History of pulmonary embolism (HCC):  CTA on 05/01/18 showed two possible tiny right lower lobe segmental/subsegmental emboli and no occlusive or more proximal embolus. -Eliquis is on hold.    -Lower extremity venous Doppler ultrasound result is pending.   -Will seriously consider IVC filter placement after the above result is considered,. -Currently consulted with hematology team in a.m.   -Query value in repeating CTA of the chest (as mentioned above, discuss further evaluate hematology)   HLD; -Crestor  COPD, mild (HCC): Stable. -Bronchodilators  HTN:  -Diovan-HCTZ is on hold -Continue amlodipine 5 mg daily -IV hydralazine prn -Continue to optimize.  Gout: -continue home allopurinol  Pancytopenia [facility]):  Patient has hx of pancytopenia.   Continue to monitor.  -Patient is followed up with hematologist, Dr. Neil Crouch  Hypokalemia:  Potassium was 3.4 on admission. Repeat potassium today is 4. Continue to monitor and replete.  Acute kidney injury:  Mild.  Likely prerenal. Resolved significantly with volume resuscitation. Continue to monitor.  Pulmonary nodule: -f/u with PCP   DVT ppx: None (due to GI bleeding, cannot use heparin and Lovenox; will need to rule out DVT due to PE, so currently cannot SCD) Code Status: Partial code. OK for CPR, but no intubation). Family Communication:  3 daughters. Disposition Plan: Likely DC home   Consultants:   GI   Procedures:   For EGD in the morning  Antimicrobials:   None   Subjective: No new complaints No abdominal pain No shortness of breath No chest pain  Objective: Vitals:   05/12/18 0252 05/12/18 0456 05/12/18 0458 05/12/18 0808  BP: (!) 128/56  122/63   Pulse: 79  77   Resp: 12  18   Temp: 98.9 F (37.2 C)  98.5 F (36.9 C)   TempSrc: Oral  Oral   SpO2: 98%  99% 98%  Weight:  89.1 kg    Height:        Intake/Output Summary (Last 24 hours) at 05/12/2018 1016 Last data filed at 05/12/2018 0900 Gross per 24 hour  Intake 1994.06 ml  Output -  Net 1994.06 ml   Filed Weights   05/11/18 1746 05/12/18 0456  Weight: 89.8 kg 89.1 kg    Examination:  General exam: Appears  calm and comfortable.   Respiratory system: Clear to auscultation. Respiratory effort normal. Cardiovascular system: S1 & S2 heard.  No pedal edema. Gastrointestinal system: Abdomen is obese, soft and nontender.  Organs are difficult to assess.   Central nervous system: Alert and oriented. No focal neurological deficits. Extremities: No leg edema.    Data Reviewed: I have personally reviewed following labs and imaging studies  CBC: Recent Labs  Lab 05/11/18 1744 05/12/18 0540  WBC 5.3 3.8*  NEUTROABS 3.2  --   HGB 6.9* 7.9*  HCT 22.4* 24.2*  MCV 92.9 86.4  PLT 123* 101*   Basic Metabolic Panel: Recent Labs  Lab 05/11/18 1744 05/12/18 0540  NA 132* 136  K 3.4* 4.0  CL 103 106  CO2 21* 23  GLUCOSE 157* 95  BUN 33* 29*  CREATININE 1.01* 1.01*  CALCIUM 9.4 9.3   GFR: Estimated Creatinine Clearance: 48 mL/min (A) (by C-G formula based on SCr of 1.01 mg/dL (H)). Liver Function Tests: No results for input(s): AST, ALT, ALKPHOS, BILITOT, PROT, ALBUMIN in the last 168 hours. No results for input(s): LIPASE, AMYLASE in the last 168 hours. No results for input(s): AMMONIA in the last 168 hours. Coagulation Profile: Recent Labs  Lab 05/12/18 0540  INR 1.11   Cardiac Enzymes: No results for input(s): CKTOTAL, CKMB, CKMBINDEX, TROPONINI in the last 168 hours. BNP (last 3 results) No results for input(s): PROBNP in the last 8760 hours. HbA1C: No results for input(s): HGBA1C in the last 72 hours. CBG: No results for input(s): GLUCAP in the last 168 hours. Lipid Profile: No results for input(s): CHOL, HDL, LDLCALC, TRIG, CHOLHDL, LDLDIRECT in the last 72 hours. Thyroid Function Tests: No results for input(s): TSH, T4TOTAL, FREET4, T3FREE, THYROIDAB in the last 72 hours. Anemia Panel: No results for input(s): VITAMINB12, FOLATE, FERRITIN, TIBC, IRON, RETICCTPCT in the last 72 hours. Urine analysis: No results found for: COLORURINE, APPEARANCEUR, LABSPEC, PHURINE,  GLUCOSEU, HGBUR, BILIRUBINUR, KETONESUR, PROTEINUR, UROBILINOGEN, NITRITE, LEUKOCYTESUR Sepsis Labs: @LABRCNTIP (procalcitonin:4,lacticidven:4)  )No results found for this or any previous visit (from the past 240 hour(s)).       Radiology Studies: Ct Abdomen Pelvis Wo Contrast  Result Date: 05/11/2018 CLINICAL DATA:  Abdominal pain. EXAM: CT ABDOMEN AND PELVIS WITHOUT CONTRAST TECHNIQUE: Multidetector CT imaging of the abdomen and pelvis was performed following the standard protocol without IV contrast. COMPARISON:  None. FINDINGS: Lower chest: Coronary artery calcification is evident. Atherosclerotic calcification is noted in the wall of the thoracic aorta. 4 mm posterior right lower lobe pulmonary nodule evident. Hepatobiliary: No focal abnormality in the liver on this study without intravenous contrast. There is no evidence for gallstones, gallbladder wall thickening, or pericholecystic fluid. No intrahepatic or extrahepatic biliary dilation. Pancreas: No focal mass lesion. No dilatation of the main duct. No intraparenchymal cyst. No peripancreatic edema. Spleen: No splenomegaly. No focal mass lesion. Adrenals/Urinary Tract: No adrenal nodule or mass. Kidneys unremarkable. No evidence for hydroureter. The urinary bladder appears normal for the degree of distention. Stomach/Bowel: Stomach is nondistended. No gastric wall thickening.  No evidence of outlet obstruction. Duodenum is normally positioned as is the ligament of Treitz. No small bowel wall thickening. No small bowel dilatation. The terminal ileum is normal. The appendix is normal. No gross colonic mass. No colonic wall thickening. Vascular/Lymphatic: There is abdominal aortic atherosclerosis without aneurysm. There is no gastrohepatic or hepatoduodenal ligament lymphadenopathy. No intraperitoneal or retroperitoneal lymphadenopathy. No pelvic sidewall lymphadenopathy. Reproductive: Uterus surgically absent.  There is no adnexal mass. Other: No  intraperitoneal free fluid. Musculoskeletal: Small bilateral groin hernias contain only fat the No worrisome lytic or sclerotic osseous abnormality. IMPRESSION: 1. No acute findings in the abdomen or pelvis. 2. 4 mm right lower lobe pulmonary nodule. No follow-up needed if patient is low-risk. Non-contrast chest CT can be considered in 12 months if patient is high-risk. This recommendation follows the consensus statement: Guidelines for Management of Incidental Pulmonary Nodules Detected on CT Images: From the Fleischner Society 2017; Radiology 2017; 284:228-243. 3.  Aortic Atherosclerois (ICD10-170.0) Electronically Signed   By: Kennith CenterEric  Mansell M.D.   On: 05/11/2018 20:52   Dg Chest 2 View  Result Date: 05/11/2018 CLINICAL DATA:  Chest pain and shortness of breath. EXAM: CHEST - 2 VIEW COMPARISON:  05/01/2018 FINDINGS: The lungs are clear without focal pneumonia, edema, pneumothorax or pleural effusion. The cardiopericardial silhouette is within normal limits for size. The visualized bony structures of the thorax are intact. Telemetry leads overlie the chest. IMPRESSION: No active cardiopulmonary disease. Electronically Signed   By: Kennith CenterEric  Mansell M.D.   On: 05/11/2018 18:46        Scheduled Meds: . allopurinol  100 mg Oral Daily  . amLODipine  5 mg Oral Daily  . cholecalciferol  5,000 Units Oral Daily  . latanoprost  1 drop Both Eyes QHS  . multivitamin with minerals  1 tablet Oral Daily  . [START ON 05/15/2018] pantoprazole  40 mg Intravenous Q12H  . rosuvastatin  10 mg Oral Q48H  . umeclidinium-vilanterol  1 puff Inhalation Daily   Continuous Infusions: . sodium chloride 75 mL/hr at 05/12/18 0900  . pantoprozole (PROTONIX) infusion    . sodium chloride       LOS: 0 days    Time spent: 35 minutes    Berton MountSylvester Delores Thelen, MD  [facility] Pager #: [phone] 7PM-7AM contact night coverage as above

## 2018-05-12 NOTE — Care Management Obs Status (Signed)
MEDICARE OBSERVATION STATUS NOTIFICATION   Patient Details  Name: Rebekah Macdonald MRN: [id number] Date of Birth: June 23, 1936   Medicare Observation Status Notification Given:  Yes    Lawerance Sabalebbie Lakenya Riendeau, RN 05/12/2018, 9:18 AM

## 2018-05-12 NOTE — Progress Notes (Signed)
Completed admission database with pt at bedside  Pt wants to know about scheduling for EGD  [facility]  She stated pt is not on the schedule and received eliquis yesterday  Paged primary MD for diet order

## 2018-05-12 NOTE — Consult Note (Signed)
Referring Provider: Dr. Dartha Lodgegbata Primary Care Physician:  Charolett Bumpersoyle, Patricia K, PA-C Primary Gastroenterologist:  Gentry FitzUnassigned  Reason for Consultation:  Melena  HPI: Rebekah Macdonald is a 82 y.o. female with 3 days of melena without hematochezia, hematemesis, nausea or vomiting. Reports dizziness for months and denies any worsening of it this week. Started on Eliquis on 05/02/18. Reports bleeding ulcer diagnosed on EGD about 8-10 years ago. Reports a normal colonoscopy in 2018. Hgb 6.9 down from 9.6 on 05/02/18. On Protonix 40 mg/day. Denies NSAIDs. Denies alcohol. Daughter at bedside.  Past Medical History:  Diagnosis Date  . COPD, mild (HCC) 03/26/2018  . Essential hypertension   . GERD (gastroesophageal reflux disease)   . Gout   . HLD (hyperlipidemia)   . Mitral valve annular calcification 03/26/2018  . Pancytopenia [facility](HCC)     Past Surgical History:  Procedure Laterality Date  . ABDOMINAL HYSTERECTOMY  1979    Prior to Admission medications   Medication Sig Start Date End Date Taking? Authorizing Provider  acetaminophen (TYLENOL) 650 MG CR tablet Take 1,300 mg by mouth daily.   Yes [provider]  albuterol (PROVENTIL HFA;VENTOLIN HFA) 108 (90 Base) MCG/ACT inhaler Inhale 1-2 puffs into the lungs every 6 (six) hours as needed for wheezing or shortness of breath.   Yes [provider]  allopurinol (ZYLOPRIM) 100 MG tablet Take 100 mg by mouth daily.   Yes [provider]  calcium carbonate (TUMS EX) 750 MG chewable tablet Chew 2 tablets by mouth 4 (four) times daily as needed for heartburn.   Yes [provider]  Cholecalciferol (VITAMIN D-3) 125 MCG (5000 UT) TABS Take 5,000 Units by mouth daily.   Yes [provider]  ELIQUIS STARTER PACK (ELIQUIS STARTER PACK) 5 MG TABS Take as directed on package: start with two-5mg  tablets twice daily for 7 days. On day 8, switch to one-5mg  tablet twice daily. Patient taking differently: Take 5-10 mg by  mouth See admin instructions. Take as directed on package: start with two-5mg  tablets twice daily for 7 days. On day 8, switch to one-5mg  tablet twice daily. 05/03/18  Yes Vann, Jessica U, DO  fluticasone (FLONASE) 50 MCG/ACT nasal spray Place 1 spray into both nostrils daily as needed for allergies or rhinitis.   Yes [provider]  latanoprost (XALATAN) 0.005 % ophthalmic solution Place 1 drop into both eyes at bedtime.   Yes [provider]  Multiple Vitamin (MULTIVITAMIN WITH MINERALS) TABS tablet Take 1 tablet by mouth daily.   Yes [provider]  Multiple Vitamins-Minerals (HAIR VITAMINS PO) Take 2 tablets by mouth daily. Hairfinity   Yes [provider]  Omega-3 Fatty Acids (FISH OIL) 1200 MG CAPS Take 1,200 mg by mouth 2 (two) times daily.   Yes [provider]  pantoprazole (PROTONIX) 40 MG tablet Take 1 tablet (40 mg total) by mouth daily. 05/04/18  Yes Vann, Jessica U, DO  rosuvastatin (CRESTOR) 10 MG tablet Take 10 mg by mouth See admin instructions. Take one tablet (10 mg) by mouth on even numbered nights   Yes [provider]  simethicone (GAS-X EXTRA STRENGTH) 125 MG chewable tablet Chew 250 mg by mouth every 6 (six) hours as needed for flatulence.   Yes [provider]  umeclidinium-vilanterol (ANORO ELLIPTA) 62.5-25 MCG/INH AEPB Inhale 1 puff into the lungs daily. 05/09/18 05/23/18 Yes [provider]  valsartan-hydrochlorothiazide (DIOVAN-HCT) 160-12.5 MG tablet Take 1 tablet by mouth daily.   Yes [provider]  calcium  carbonate (TUMS - DOSED IN MG ELEMENTAL CALCIUM) 500 MG chewable tablet Chew 1 tablet (200 mg of elemental calcium total) by mouth 3 (three) times daily with meals as needed for indigestion or heartburn. Patient not taking: Reported on 05/11/2018 05/03/18   Joseph Art, DO  simethicone (MYLICON) 80 MG chewable tablet Chew 1 tablet (80 mg total) by mouth every 6 (six) hours as needed  for flatulence. Patient not taking: Reported on 05/11/2018 05/03/18   Joseph Art, DO    Scheduled Meds: . allopurinol  100 mg Oral Daily  . amLODipine  5 mg Oral Daily  . cholecalciferol  5,000 Units Oral Daily  . latanoprost  1 drop Both Eyes QHS  . multivitamin with minerals  1 tablet Oral Daily  . [START ON 05/15/2018] pantoprazole  40 mg Intravenous Q12H  . rosuvastatin  10 mg Oral Q48H  . umeclidinium-vilanterol  1 puff Inhalation Daily   Continuous Infusions: . sodium chloride 75 mL/hr at 05/12/18 0900  . pantoprozole (PROTONIX) infusion    . sodium chloride     PRN Meds:.acetaminophen **OR** acetaminophen, albuterol, calcium carbonate, dextromethorphan-guaiFENesin, fluticasone, hydrALAZINE, morphine injection, ondansetron **OR** ondansetron (ZOFRAN) IV, oxyCODONE-acetaminophen, simethicone, zolpidem  Allergies as of 05/11/2018 - Review Complete 05/11/2018  Allergen Reaction Noted  . Ace inhibitors Cough 05/02/2018  . Penicillins Other (See Comments) 05/02/2018    Family History  Problem Relation Age of Onset  . Prostate cancer Father   . Cancer - Colon Brother     Social History   Socioeconomic History  . Marital status: Widowed    Spouse name: Not on file  . Number of children: Not on file  . Years of education: Not on file  . Highest education level: Not on file  Occupational History  . Not on file  Social Needs  . Financial resource strain: Not hard at all  . Food insecurity:    Worry: Never true    Inability: Never true  . Transportation needs:    Medical: No    Non-medical: No  Tobacco Use  . Smoking status: Former Smoker    Last attempt to quit: 1989    Years since quitting: 30.8  . Smokeless tobacco: Never Used  Substance and Sexual Activity  . Alcohol use: Never    Frequency: Never  . Drug use: Never  . Sexual activity: Not Currently  Lifestyle  . Physical activity:    Days per week: 0 days    Minutes per session: 0 min  . Stress:  Not at all  Relationships  . Social connections:    Talks on phone: More than three times a week    Gets together: More than three times a week    Attends religious service: More than 4 times per year    Active member of club or organization: Yes    Attends meetings of clubs or organizations: More than 4 times per year    Relationship status: Widowed  . Intimate partner violence:    Fear of current or ex partner: No    Emotionally abused: No    Physically abused: No    Forced sexual activity: No  Other Topics Concern  . Not on file  Social History Narrative  . Not on file    Review of Systems: All negative except as stated above in HPI.  Physical Exam: Vital signs: Vitals:   05/12/18 0458 05/12/18 0808  BP: 122/63   Pulse: 77   Resp: 18  Temp: 98.5 F (36.9 C)   SpO2: 99% 98%   Last BM Date: 05/11/18 General:   Alert,  Well-developed, well-nourished, pleasant and cooperative in NAD, elderly Head: normocephalic, atraumatic Eyes: anicteric sclera ENT: oropharynx clear Neck: supple, nontender Lungs:  Clear throughout to auscultation.   No wheezes, crackles, or rhonchi. No acute distress. Heart:  Regular rate and rhythm; no murmurs, clicks, rubs,  or gallops. Abdomen: soft, nontender, nondistended, +BS  Rectal:  Deferred Ext: no edema  GI:  Lab Results: Recent Labs    05/11/18 1744 05/12/18 0540  WBC 5.3 3.8*  HGB 6.9* 7.9*  HCT 22.4* 24.2*  PLT 123* 101*   BMET Recent Labs    05/11/18 1744 05/12/18 0540  NA 132* 136  K 3.4* 4.0  CL 103 106  CO2 21* 23  GLUCOSE 157* 95  BUN 33* 29*  CREATININE 1.01* 1.01*  CALCIUM 9.4 9.3   LFT No results for input(s): PROT, ALBUMIN, AST, ALT, ALKPHOS, BILITOT, BILIDIR, IBILI in the last 72 hours. PT/INR Recent Labs    05/12/18 0540  LABPROT 14.3  INR 1.11     Studies/Results: Ct Abdomen Pelvis Wo Contrast  Result Date: 05/11/2018 CLINICAL DATA:  Abdominal pain. EXAM: CT ABDOMEN AND PELVIS WITHOUT  CONTRAST TECHNIQUE: Multidetector CT imaging of the abdomen and pelvis was performed following the standard protocol without IV contrast. COMPARISON:  None. FINDINGS: Lower chest: Coronary artery calcification is evident. Atherosclerotic calcification is noted in the wall of the thoracic aorta. 4 mm posterior right lower lobe pulmonary nodule evident. Hepatobiliary: No focal abnormality in the liver on this study without intravenous contrast. There is no evidence for gallstones, gallbladder wall thickening, or pericholecystic fluid. No intrahepatic or extrahepatic biliary dilation. Pancreas: No focal mass lesion. No dilatation of the main duct. No intraparenchymal cyst. No peripancreatic edema. Spleen: No splenomegaly. No focal mass lesion. Adrenals/Urinary Tract: No adrenal nodule or mass. Kidneys unremarkable. No evidence for hydroureter. The urinary bladder appears normal for the degree of distention. Stomach/Bowel: Stomach is nondistended. No gastric wall thickening. No evidence of outlet obstruction. Duodenum is normally positioned as is the ligament of Treitz. No small bowel wall thickening. No small bowel dilatation. The terminal ileum is normal. The appendix is normal. No gross colonic mass. No colonic wall thickening. Vascular/Lymphatic: There is abdominal aortic atherosclerosis without aneurysm. There is no gastrohepatic or hepatoduodenal ligament lymphadenopathy. No intraperitoneal or retroperitoneal lymphadenopathy. No pelvic sidewall lymphadenopathy. Reproductive: Uterus surgically absent.  There is no adnexal mass. Other: No intraperitoneal free fluid. Musculoskeletal: Small bilateral groin hernias contain only fat the No worrisome lytic or sclerotic osseous abnormality. IMPRESSION: 1. No acute findings in the abdomen or pelvis. 2. 4 mm right lower lobe pulmonary nodule. No follow-up needed if patient is low-risk. Non-contrast chest CT can be considered in 12 months if patient is high-risk. This  recommendation follows the consensus statement: Guidelines for Management of Incidental Pulmonary Nodules Detected on CT Images: From the Fleischner Society 2017; Radiology 2017; 284:228-243. 3.  Aortic Atherosclerois (ICD10-170.0) Electronically Signed   By: Kennith Center M.D.   On: 05/11/2018 20:52   Dg Chest 2 View  Result Date: 05/11/2018 CLINICAL DATA:  Chest pain and shortness of breath. EXAM: CHEST - 2 VIEW COMPARISON:  05/01/2018 FINDINGS: The lungs are clear without focal pneumonia, edema, pneumothorax or pleural effusion. The cardiopericardial silhouette is within normal limits for size. The visualized bony structures of the thorax are intact. Telemetry leads overlie the chest. IMPRESSION: No active cardiopulmonary disease.  Electronically Signed   By: Kennith Center M.D.   On: 05/11/2018 18:46    Impression/Plan: 82 yo with 3 days of melenic stool in the setting of Eliquis and history of peptic ulcer disease about 8 years ago. Eliquis on hold and last dose was yesterday morning as outpt. EGD needed to check for peptic ulcer disease. Continue on Protonix drip. Clear liquid diet. NPO p MN for EGD to be done tomorrow by Dr. Dulce Sellar.    LOS: 0 days   Shirley Friar  05/12/2018, 11:36 AM  Questions please call [phone]

## 2018-05-12 NOTE — Progress Notes (Signed)
VASCULAR LAB PRELIMINARY  PRELIMINARY  PRELIMINARY  PRELIMINARY  Bilateral lower extremity venous duplexcompleted.    Preliminary report:  There is no DVT or SVT noted in the bilateral lower extremities.   Candelaria Pies, RVT 05/12/2018, 12:57 PM

## 2018-05-12 NOTE — H&P (View-Only) (Signed)
Referring Provider: Dr. Ogbata Primary Care Physician:  Doyle, Patricia K, PA-C Primary Gastroenterologist:  Unassigned  Reason for Consultation:  Melena  HPI: Rebekah Macdonald is a 82 y.o. female with 3 days of melena without hematochezia, hematemesis, nausea or vomiting. Reports dizziness for months and denies any worsening of it this week. Started on Eliquis on 05/02/18. Reports bleeding ulcer diagnosed on EGD about 8-10 years ago. Reports a normal colonoscopy in 2018. Hgb 6.9 down from 9.6 on 05/02/18. On Protonix 40 mg/day. Denies NSAIDs. Denies alcohol. Daughter at bedside.  Past Medical History:  Diagnosis Date  . COPD, mild (HCC) 03/26/2018  . Essential hypertension   . GERD (gastroesophageal reflux disease)   . Gout   . HLD (hyperlipidemia)   . Mitral valve annular calcification 03/26/2018  . Pancytopenia (HCC)     Past Surgical History:  Procedure Laterality Date  . ABDOMINAL HYSTERECTOMY  1979    Prior to Admission medications   Medication Sig Start Date End Date Taking? Authorizing Provider  acetaminophen (TYLENOL) 650 MG CR tablet Take 1,300 mg by mouth daily.   Yes [provider]  albuterol (PROVENTIL HFA;VENTOLIN HFA) 108 (90 Base) MCG/ACT inhaler Inhale 1-2 puffs into the lungs every 6 (six) hours as needed for wheezing or shortness of breath.   Yes [provider]  allopurinol (ZYLOPRIM) 100 MG tablet Take 100 mg by mouth daily.   Yes [provider]  calcium carbonate (TUMS EX) 750 MG chewable tablet Chew 2 tablets by mouth 4 (four) times daily as needed for heartburn.   Yes [provider]  Cholecalciferol (VITAMIN D-3) 125 MCG (5000 UT) TABS Take 5,000 Units by mouth daily.   Yes [provider]  ELIQUIS STARTER PACK (ELIQUIS STARTER PACK) 5 MG TABS Take as directed on package: start with two-5mg tablets twice daily for 7 days. On day 8, switch to one-5mg tablet twice daily. Patient taking differently: Take 5-10 mg by  mouth See admin instructions. Take as directed on package: start with two-5mg tablets twice daily for 7 days. On day 8, switch to one-5mg tablet twice daily. 05/03/18  Yes Vann, Jessica U, DO  fluticasone (FLONASE) 50 MCG/ACT nasal spray Place 1 spray into both nostrils daily as needed for allergies or rhinitis.   Yes [provider]  latanoprost (XALATAN) 0.005 % ophthalmic solution Place 1 drop into both eyes at bedtime.   Yes [provider]  Multiple Vitamin (MULTIVITAMIN WITH MINERALS) TABS tablet Take 1 tablet by mouth daily.   Yes [provider]  Multiple Vitamins-Minerals (HAIR VITAMINS PO) Take 2 tablets by mouth daily. Hairfinity   Yes [provider]  Omega-3 Fatty Acids (FISH OIL) 1200 MG CAPS Take 1,200 mg by mouth 2 (two) times daily.   Yes [provider]  pantoprazole (PROTONIX) 40 MG tablet Take 1 tablet (40 mg total) by mouth daily. 05/04/18  Yes Vann, Jessica U, DO  rosuvastatin (CRESTOR) 10 MG tablet Take 10 mg by mouth See admin instructions. Take one tablet (10 mg) by mouth on even numbered nights   Yes [provider]  simethicone (GAS-X EXTRA STRENGTH) 125 MG chewable tablet Chew 250 mg by mouth every 6 (six) hours as needed for flatulence.   Yes [provider]  umeclidinium-vilanterol (ANORO ELLIPTA) 62.5-25 MCG/INH AEPB Inhale 1 puff into the lungs daily. 05/09/18 05/23/18 Yes [provider]  valsartan-hydrochlorothiazide (DIOVAN-HCT) 160-12.5 MG tablet Take 1 tablet by mouth daily.   Yes [provider]  calcium   carbonate (TUMS - DOSED IN MG ELEMENTAL CALCIUM) 500 MG chewable tablet Chew 1 tablet (200 mg of elemental calcium total) by mouth 3 (three) times daily with meals as needed for indigestion or heartburn. Patient not taking: Reported on 05/11/2018 05/03/18   Vann, Jessica U, DO  simethicone (MYLICON) 80 MG chewable tablet Chew 1 tablet (80 mg total) by mouth every 6 (six) hours as needed  for flatulence. Patient not taking: Reported on 05/11/2018 05/03/18   Vann, Jessica U, DO    Scheduled Meds: . allopurinol  100 mg Oral Daily  . amLODipine  5 mg Oral Daily  . cholecalciferol  5,000 Units Oral Daily  . latanoprost  1 drop Both Eyes QHS  . multivitamin with minerals  1 tablet Oral Daily  . [START ON 05/15/2018] pantoprazole  40 mg Intravenous Q12H  . rosuvastatin  10 mg Oral Q48H  . umeclidinium-vilanterol  1 puff Inhalation Daily   Continuous Infusions: . sodium chloride 75 mL/hr at 05/12/18 0900  . pantoprozole (PROTONIX) infusion    . sodium chloride     PRN Meds:.acetaminophen **OR** acetaminophen, albuterol, calcium carbonate, dextromethorphan-guaiFENesin, fluticasone, hydrALAZINE, morphine injection, ondansetron **OR** ondansetron (ZOFRAN) IV, oxyCODONE-acetaminophen, simethicone, zolpidem  Allergies as of 05/11/2018 - Review Complete 05/11/2018  Allergen Reaction Noted  . Ace inhibitors Cough 05/02/2018  . Penicillins Other (See Comments) 05/02/2018    Family History  Problem Relation Age of Onset  . Prostate cancer Father   . Cancer - Colon Brother     Social History   Socioeconomic History  . Marital status: Widowed    Spouse name: Not on file  . Number of children: Not on file  . Years of education: Not on file  . Highest education level: Not on file  Occupational History  . Not on file  Social Needs  . Financial resource strain: Not hard at all  . Food insecurity:    Worry: Never true    Inability: Never true  . Transportation needs:    Medical: No    Non-medical: No  Tobacco Use  . Smoking status: Former Smoker    Last attempt to quit: 1989    Years since quitting: 30.8  . Smokeless tobacco: Never Used  Substance and Sexual Activity  . Alcohol use: Never    Frequency: Never  . Drug use: Never  . Sexual activity: Not Currently  Lifestyle  . Physical activity:    Days per week: 0 days    Minutes per session: 0 min  . Stress:  Not at all  Relationships  . Social connections:    Talks on phone: More than three times a week    Gets together: More than three times a week    Attends religious service: More than 4 times per year    Active member of club or organization: Yes    Attends meetings of clubs or organizations: More than 4 times per year    Relationship status: Widowed  . Intimate partner violence:    Fear of current or ex partner: No    Emotionally abused: No    Physically abused: No    Forced sexual activity: No  Other Topics Concern  . Not on file  Social History Narrative  . Not on file    Review of Systems: All negative except as stated above in HPI.  Physical Exam: Vital signs: Vitals:   05/12/18 0458 05/12/18 0808  BP: 122/63   Pulse: 77   Resp: 18     Temp: 98.5 F (36.9 C)   SpO2: 99% 98%   Last BM Date: 05/11/18 General:   Alert,  Well-developed, well-nourished, pleasant and cooperative in NAD, elderly Head: normocephalic, atraumatic Eyes: anicteric sclera ENT: oropharynx clear Neck: supple, nontender Lungs:  Clear throughout to auscultation.   No wheezes, crackles, or rhonchi. No acute distress. Heart:  Regular rate and rhythm; no murmurs, clicks, rubs,  or gallops. Abdomen: soft, nontender, nondistended, +BS  Rectal:  Deferred Ext: no edema  GI:  Lab Results: Recent Labs    05/11/18 1744 05/12/18 0540  WBC 5.3 3.8*  HGB 6.9* 7.9*  HCT 22.4* 24.2*  PLT 123* 101*   BMET Recent Labs    05/11/18 1744 05/12/18 0540  NA 132* 136  K 3.4* 4.0  CL 103 106  CO2 21* 23  GLUCOSE 157* 95  BUN 33* 29*  CREATININE 1.01* 1.01*  CALCIUM 9.4 9.3   LFT No results for input(s): PROT, ALBUMIN, AST, ALT, ALKPHOS, BILITOT, BILIDIR, IBILI in the last 72 hours. PT/INR Recent Labs    05/12/18 0540  LABPROT 14.3  INR 1.11     Studies/Results: Ct Abdomen Pelvis Wo Contrast  Result Date: 05/11/2018 CLINICAL DATA:  Abdominal pain. EXAM: CT ABDOMEN AND PELVIS WITHOUT  CONTRAST TECHNIQUE: Multidetector CT imaging of the abdomen and pelvis was performed following the standard protocol without IV contrast. COMPARISON:  None. FINDINGS: Lower chest: Coronary artery calcification is evident. Atherosclerotic calcification is noted in the wall of the thoracic aorta. 4 mm posterior right lower lobe pulmonary nodule evident. Hepatobiliary: No focal abnormality in the liver on this study without intravenous contrast. There is no evidence for gallstones, gallbladder wall thickening, or pericholecystic fluid. No intrahepatic or extrahepatic biliary dilation. Pancreas: No focal mass lesion. No dilatation of the main duct. No intraparenchymal cyst. No peripancreatic edema. Spleen: No splenomegaly. No focal mass lesion. Adrenals/Urinary Tract: No adrenal nodule or mass. Kidneys unremarkable. No evidence for hydroureter. The urinary bladder appears normal for the degree of distention. Stomach/Bowel: Stomach is nondistended. No gastric wall thickening. No evidence of outlet obstruction. Duodenum is normally positioned as is the ligament of Treitz. No small bowel wall thickening. No small bowel dilatation. The terminal ileum is normal. The appendix is normal. No gross colonic mass. No colonic wall thickening. Vascular/Lymphatic: There is abdominal aortic atherosclerosis without aneurysm. There is no gastrohepatic or hepatoduodenal ligament lymphadenopathy. No intraperitoneal or retroperitoneal lymphadenopathy. No pelvic sidewall lymphadenopathy. Reproductive: Uterus surgically absent.  There is no adnexal mass. Other: No intraperitoneal free fluid. Musculoskeletal: Small bilateral groin hernias contain only fat the No worrisome lytic or sclerotic osseous abnormality. IMPRESSION: 1. No acute findings in the abdomen or pelvis. 2. 4 mm right lower lobe pulmonary nodule. No follow-up needed if patient is low-risk. Non-contrast chest CT can be considered in 12 months if patient is high-risk. This  recommendation follows the consensus statement: Guidelines for Management of Incidental Pulmonary Nodules Detected on CT Images: From the Fleischner Society 2017; Radiology 2017; 284:228-243. 3.  Aortic Atherosclerois (ICD10-170.0) Electronically Signed   By: Eric  Mansell M.D.   On: 05/11/2018 20:52   Dg Chest 2 View  Result Date: 05/11/2018 CLINICAL DATA:  Chest pain and shortness of breath. EXAM: CHEST - 2 VIEW COMPARISON:  05/01/2018 FINDINGS: The lungs are clear without focal pneumonia, edema, pneumothorax or pleural effusion. The cardiopericardial silhouette is within normal limits for size. The visualized bony structures of the thorax are intact. Telemetry leads overlie the chest. IMPRESSION: No active cardiopulmonary disease.   Electronically Signed   By: Eric  Mansell M.D.   On: 05/11/2018 18:46    Impression/Plan: 82 yo with 3 days of melenic stool in the setting of Eliquis and history of peptic ulcer disease about 8 years ago. Eliquis on hold and last dose was yesterday morning as outpt. EGD needed to check for peptic ulcer disease. Continue on Protonix drip. Clear liquid diet. NPO p MN for EGD to be done tomorrow by Dr. Outlaw.    LOS: 0 days   Jennings Corado C Brentton Wardlow  05/12/2018, 11:36 AM  Questions please call [phone]  

## 2018-05-13 ENCOUNTER — Encounter (HOSPITAL_COMMUNITY)
Admission: EM | Disposition: A | Discharge status: Home Health | Payer: Self-pay | Source: Home / Self Care | Attending: Internal Medicine

## 2018-05-13 ENCOUNTER — Observation Stay (HOSPITAL_COMMUNITY): Payer: Medicare HMO | Admitting: Anesthesiology

## 2018-05-13 ENCOUNTER — Encounter (HOSPITAL_COMMUNITY): Payer: Self-pay

## 2018-05-13 DIAGNOSIS — K31811 Angiodysplasia of stomach and duodenum with bleeding: Secondary | ICD-10-CM | POA: Diagnosis present

## 2018-05-13 DIAGNOSIS — K219 Gastro-esophageal reflux disease without esophagitis: Secondary | ICD-10-CM | POA: Diagnosis present

## 2018-05-13 DIAGNOSIS — Z8711 Personal history of peptic ulcer disease: Secondary | ICD-10-CM | POA: Diagnosis not present

## 2018-05-13 DIAGNOSIS — J449 Chronic obstructive pulmonary disease, unspecified: Secondary | ICD-10-CM | POA: Diagnosis present

## 2018-05-13 DIAGNOSIS — Z7901 Long term (current) use of anticoagulants: Secondary | ICD-10-CM | POA: Diagnosis not present

## 2018-05-13 DIAGNOSIS — M109 Gout, unspecified: Secondary | ICD-10-CM | POA: Diagnosis present

## 2018-05-13 DIAGNOSIS — K922 Gastrointestinal hemorrhage, unspecified: Secondary | ICD-10-CM | POA: Diagnosis present

## 2018-05-13 DIAGNOSIS — E876 Hypokalemia: Secondary | ICD-10-CM | POA: Diagnosis present

## 2018-05-13 DIAGNOSIS — K449 Diaphragmatic hernia without obstruction or gangrene: Secondary | ICD-10-CM | POA: Diagnosis present

## 2018-05-13 DIAGNOSIS — Z86711 Personal history of pulmonary embolism: Secondary | ICD-10-CM | POA: Diagnosis not present

## 2018-05-13 DIAGNOSIS — Z7951 Long term (current) use of inhaled steroids: Secondary | ICD-10-CM | POA: Diagnosis not present

## 2018-05-13 DIAGNOSIS — D62 Acute posthemorrhagic anemia: Secondary | ICD-10-CM | POA: Diagnosis present

## 2018-05-13 DIAGNOSIS — Z880 Allergy status to penicillin: Secondary | ICD-10-CM | POA: Diagnosis not present

## 2018-05-13 DIAGNOSIS — E785 Hyperlipidemia, unspecified: Secondary | ICD-10-CM | POA: Diagnosis present

## 2018-05-13 DIAGNOSIS — R911 Solitary pulmonary nodule: Secondary | ICD-10-CM

## 2018-05-13 DIAGNOSIS — I129 Hypertensive chronic kidney disease with stage 1 through stage 4 chronic kidney disease, or unspecified chronic kidney disease: Secondary | ICD-10-CM | POA: Diagnosis present

## 2018-05-13 DIAGNOSIS — G4733 Obstructive sleep apnea (adult) (pediatric): Secondary | ICD-10-CM | POA: Diagnosis present

## 2018-05-13 DIAGNOSIS — N179 Acute kidney failure, unspecified: Secondary | ICD-10-CM | POA: Diagnosis present

## 2018-05-13 DIAGNOSIS — Z66 Do not resuscitate: Secondary | ICD-10-CM | POA: Diagnosis present

## 2018-05-13 DIAGNOSIS — R0602 Shortness of breath: Secondary | ICD-10-CM | POA: Diagnosis present

## 2018-05-13 DIAGNOSIS — K5710 Diverticulosis of small intestine without perforation or abscess without bleeding: Secondary | ICD-10-CM | POA: Diagnosis present

## 2018-05-13 DIAGNOSIS — I2721 Secondary pulmonary arterial hypertension: Secondary | ICD-10-CM | POA: Diagnosis present

## 2018-05-13 DIAGNOSIS — D61818 Other pancytopenia: Secondary | ICD-10-CM | POA: Diagnosis present

## 2018-05-13 DIAGNOSIS — N183 Chronic kidney disease, stage 3 (moderate): Secondary | ICD-10-CM | POA: Diagnosis present

## 2018-05-13 DIAGNOSIS — Z87891 Personal history of nicotine dependence: Secondary | ICD-10-CM | POA: Diagnosis not present

## 2018-05-13 DIAGNOSIS — I10 Essential (primary) hypertension: Secondary | ICD-10-CM | POA: Diagnosis not present

## 2018-05-13 DIAGNOSIS — Z79899 Other long term (current) drug therapy: Secondary | ICD-10-CM | POA: Diagnosis not present

## 2018-05-13 HISTORY — PX: ESOPHAGOGASTRODUODENOSCOPY (EGD) WITH PROPOFOL: SHX5813

## 2018-05-13 LAB — CBC
HCT: 24.4 % — ABNORMAL LOW (ref 36.0–46.0)
HCT: 24.8 % — ABNORMAL LOW (ref 36.0–46.0)
HEMOGLOBIN: 8 g/dL — AB (ref 12.0–15.0)
Hemoglobin: 7.8 g/dL — ABNORMAL LOW (ref 12.0–15.0)
MCH: 28.2 pg (ref 26.0–34.0)
MCH: 28.4 pg (ref 26.0–34.0)
MCHC: 32 g/dL (ref 30.0–36.0)
MCHC: 32.3 g/dL (ref 30.0–36.0)
MCV: 88.1 fL (ref 80.0–100.0)
MCV: 87.9 fL (ref 80.0–100.0)
NRBC: 0 % (ref 0.0–0.2)
PLATELETS: 120 10*3/uL — AB (ref 150–400)
Platelets: 112 10*3/uL — ABNORMAL LOW (ref 150–400)
RBC: 2.77 MIL/uL — ABNORMAL LOW (ref 3.87–5.11)
RBC: 2.82 MIL/uL — ABNORMAL LOW (ref 3.87–5.11)
RDW: 15.8 % — ABNORMAL HIGH (ref 11.5–15.5)
RDW: 15.8 % — ABNORMAL HIGH (ref 11.5–15.5)
WBC: 4.2 10*3/uL (ref 4.0–10.5)
WBC: 6.1 10*3/uL (ref 4.0–10.5)
nRBC: 0 % (ref 0.0–0.2)

## 2018-05-13 LAB — TROPONIN I
Troponin I: <0.03 ng/mL
Troponin I: <0.03 ng/mL

## 2018-05-13 SURGERY — ESOPHAGOGASTRODUODENOSCOPY (EGD) WITH PROPOFOL
Anesthesia: Monitor Anesthesia Care

## 2018-05-13 MED ORDER — MEPERIDINE HCL 100 MG/ML IJ SOLN: 6.2500 mg | INTRAMUSCULAR | Status: DC | PRN

## 2018-05-13 MED ORDER — ALUM & MAG HYDROXIDE-SIMETH 200-200-20 MG/5ML PO SUSP
30.0000 mL | Freq: Four times a day (QID) | ORAL | Status: DC | PRN
  Administered 2018-05-13 – 2018-05-15 (×5): 30 mL via ORAL
  Filled 2018-05-13 (×5): qty 30

## 2018-05-13 MED ORDER — PROMETHAZINE HCL 25 MG/ML IJ SOLN: 6.2500 mg | INTRAMUSCULAR | Status: DC | PRN

## 2018-05-13 MED ORDER — MIDAZOLAM HCL 2 MG/2ML IJ SOLN: 0.5000 mg | Freq: Once | INTRAMUSCULAR | Status: DC | PRN

## 2018-05-13 MED ORDER — LIDOCAINE HCL (CARDIAC) PF 100 MG/5ML IV SOSY
PREFILLED_SYRINGE | INTRAVENOUS | Status: DC | PRN
  Administered 2018-05-13: 60 mg via INTRAVENOUS

## 2018-05-13 MED ORDER — GLUCAGON HCL RDNA (DIAGNOSTIC) 1 MG IJ SOLR
INTRAMUSCULAR | Status: DC | PRN
  Administered 2018-05-13: 0.25 mg via INTRAVENOUS

## 2018-05-13 MED ORDER — GLUCAGON HCL RDNA (DIAGNOSTIC) 1 MG IJ SOLR
INTRAMUSCULAR | Status: AC
  Filled 2018-05-13: qty 1

## 2018-05-13 MED ORDER — PROPOFOL 10 MG/ML IV BOLUS
INTRAVENOUS | Status: DC | PRN
  Administered 2018-05-13 (×4): 10 mg via INTRAVENOUS
  Administered 2018-05-13 (×2): 20 mg via INTRAVENOUS

## 2018-05-13 MED ORDER — NITROGLYCERIN 0.4 MG SL SUBL
0.4000 mg | SUBLINGUAL_TABLET | SUBLINGUAL | Status: DC | PRN
  Filled 2018-05-13: qty 1

## 2018-05-13 MED ORDER — PROPOFOL 500 MG/50ML IV EMUL
INTRAVENOUS | Status: DC | PRN
  Administered 2018-05-13: 50 ug/kg/min via INTRAVENOUS

## 2018-05-13 SURGICAL SUPPLY — 15 items

## 2018-05-13 NOTE — Progress Notes (Signed)
Patient ID: Rebekah Macdonald, female   DOB: 1936-03-01, 82 y.o.   MRN: [id number]   CTA chest and chart reviewed for possible IVC filter insertion, in the setting of melena while on antocoagulation  05/01/18 CTA chest findings are questionable for tiny RLL PE vs artifact.  This degree of PE is likely insignificant, and I would not recommend continued long term anticoagulation at her age or any filter at this time  D/w Dr Waymon AmatoHongalgi

## 2018-05-13 NOTE — Progress Notes (Addendum)
PROGRESS NOTE   Rebekah Macdonald  NWG:[id number]    DOB: [birth year]    DOA: 05/11/2018  PCP: Charolett Bumpersoyle, Patricia K, PA-C   I have briefly reviewed patients previous medical records in [facility].  Brief Narrative:  82 year old female with PMH of PAH by CT scan, chronic anemia with baseline approximately 10 g per DL, chronic pancytopenia-nontransfusion dependent, mild COPD based on PFTs 2019, untreated OSA-reports intolerance to CPAP, HTN, chronic arthritis on acetaminophen, hyperlipidemia, gout, recent hospitalization 05/01/2018-05/03/2018 when she was admitted for worsening dyspnea and intermittent chest pressure with palpitations for which she had been seen by outpatient pulmonology resulting in cardiology consultation, TEE 03/2017 showed LVEF 70% and RVSP 33 mmHg, HRCT 04/13/2018 showed emphysema and enlarged pulmonary artery, started on outpatient Spiriva, during recent hospitalization CTA chest suggested possible 2 small segmental/subsegmental pulmonary emboli and it was felt that her acute on subacute/chronic dyspnea was likely multifactorial due to untreated OSA in the setting of chronic anemia, mild COPD and newly diagnosed PE contributed to worsening dyspnea, she was started on Eliquis, now returned with 3 days of melena without hematochezia, hematemesis, nausea or vomiting.  She was admitted for acute upper GI bleed and acute blood loss anemia in context of newly started Eliquis.  GI consulted and underwent EGD 11/18 with findings as below.  IR recommended no anticoagulation or IVC filter after reviewing CTA chest results of 11/6 where they felt that the findings were not convincing for significant PE.   Assessment & Plan:   Principal Problem:   GIB (gastrointestinal bleeding) Active Problems:   Pulmonary embolism (HCC)   COPD, mild (HCC)   HLD (hyperlipidemia)   Essential hypertension   Gout   Pancytopenia (HCC)   Symptomatic anemia   Hypokalemia   CKD (chronic kidney disease),  stage III (HCC)   Pulmonary nodule   Acute upper GI bleed: [facility] was consulted.  She reported history of PUD and bleeding ulcer diagnosed by EGD 8 to 10 years ago, reported normal colonoscopy 2018.  Denied NSAIDs or alcohol use.  Eliquis was obviously held, last dose 11/16.  Treated with Protonix drip, bowel rest/clear liquids.  EGD performed 11/18, detailed report as below but in summary showed nonbleeding erosive gastropathy, a few recently bleeding angiectasia is in the distal duodenum for which clips were placed.  I discussed with Dr. Dulce Sellarutlaw, GI who recommended no anticoagulants for several weeks, remains at high risk for rebleeding if anticoagulation resumed, if rebleeding/recurrent bleeding occurs then consider IR consultation for angiogram.  And pelvis were negative for acute abdominal abnormalities.  Acute blood loss anemia complicating chronic anemia: Hemoglobin at recent discharge on 11/7: 9.6.  Presented with hemoglobin of 6.9.  Secondary to GI bleed.  Transfused 2 units of PRBCs.  Hemoglobin improved to 8.3, down to 7.9.  Follow CBCs closely and transfuse if hemoglobin drops <7.5.  Recent miniscule acute PE Vs artifact: CTA chest 05/01/2018 showed 2 possible tiny right lower lobe segmental/subsegmental emboli and no occlusive or more proximal embolus.  Eliquis had been started.  Eliquis currently discontinued due to GI bleed and acute blood loss anemia.  Lower extremity venous Dopplers negative for DVT.  SCDs placed.  As per my discussion with [facility] and a pulmonologist, consulted IR for retrievable IVC filter placement due to high risk for recurrent GI bleeding on anticoagulation.  After order for IVC filter was placed, Dr. Jetty Peeksrevor Schick, IR call me back to discuss.  He reviewed CTA chest images from 05/01/2018 and indicated  that findings were either of minuscule acute PE versus artifact and he would not have recommended anticoagulation and hence does not seem a role for IVC filter at this  time.  Plans for IVC filter canceled at this time.  Atypical chest pain: Noted on 11/18.  Resolved spontaneously.  Suspect GI in origin and less likely ACS or due to PE.  Hemodynamically stable and not hypoxic.  EKG without acute changes.  Troponin x1 Neg.  PPI and Maalox as needed.  Hyperlipidemia: Crestor.  COPD: Stable without clinical bronchospasm.  Essential hypertension: Controlled.  Diovan HCTZ held.  Continue amlodipine.  Gout: No flare.  Continue allopurinol.  Pancytopenia: Anemia transfused as reported above.  Thrombocytopenia likely related to acute blood loss, stable.  Leukopenia currently resolved.  Reportedly follows with hematologist Dr. Neil Crouch as outpatient.  Hypokalemia: Replaced.  I do not think that patient had acute kidney injury.  Pulmonary nodule: Noted on CT abdomen and pelvis this admission, 4 mm right lower lobe pulmonary nodule.  Outpatient follow-up with PCP.  Pulmonary artery hypertension: As per recent discharge summary 11/8, uncertain if pH seen on CT is related to chronic thromboembolic disease pulmonary hypertension or more so from untreated OSA.  Anticoagulation discussion as above.  Outpatient follow-up with pulmonology.  GERD: EGD findings as below.  Continue PPI.  OSA: Intolerant of CPAP.  Outpatient follow-up.   DVT prophylaxis: SCDs. Code Status: Partial/DNI Family Communication: I discussed in detail with patient and her daughter at bedside, updated care and answered questions, they are agreeable to IVC filter. Disposition: DC home pending clinical improvement.  She was initially admitted as observation but given acute GI bleed due to bleeding angioiectasias, required clips at EGD today, acute blood loss anemia that required transfusion, remains at risk for rebleeding, requires inflation close monitoring of her anemia, ongoing IV PPI and reassessment by GI, meets criteria for inpatient admission and hence changed.   Consultants:  [facility]. Interventional radiology.  Procedures:  EGD 05/13/2018  Impression: - Small hiatal hernia. - Non-bleeding erosive gastropathy. - A few recently bleeding angioectasias in the distal duodenum. Clips were placed. - Non-bleeding duodenal diverticulum.  Recommendation: - Clear liquid diet today. - No aspirin, ibuprofen, naproxen, or other non-steroidal anti-inflammatory drugs until further notice. - Advise no anticoagulants for the time being. - Use Protonix (pantoprazole) 40 mg IV daily until further notice. - If rebleeding/recurrent bleeding, would consider IR consultation for angiogram (clips placed today would mark the spot of the bleeding). - Follow CBC. - [facility] will follow.   Antimicrobials:  None.   Subjective: Patient seen this morning prior to procedure.  I was paged by RN stating patient reported chest pain.  I arrived at bedside shortly thereafter and interviewed patient in the presence of RN and her daughter at bedside. She reports left lower chest pain/under the rib cage ongoing since recent hospital discharge, intermittent, moderate intensity, worsened with intermittent dry cough, relieved with Tums.  This morning noted midsternal 9/10 worsening chest pain without radiation, tight/burning in nature, resolved gradually and spontaneously within 30 minutes.  Had BM yesterday, black tarry stools.  No nausea or vomiting.  No dizziness lightheadedness.  ROS: As above, otherwise negative.  Objective:  Vitals:   05/13/18 1308 05/13/18 1310 05/13/18 1320 05/13/18 1344  BP: (!) 132/56 (!) 132/56 127/67 136/68  Pulse: (!) 31 81 80 72  Resp: 17 18 15    Temp: 97.6 F (36.4 C)     TempSrc: Oral  SpO2: 100% 100% 100% 99%  Weight:      Height:        Examination:  General exam: Pleasant elderly female, moderately built and nourished lying comfortably propped up in bed. Respiratory system: Clear to auscultation. Respiratory effort normal. Cardiovascular system:  S1 & S2 heard, RRR. No JVD, murmurs, rubs, gallops or clicks. No pedal edema.  Telemetry personally reviewed: Sinus rhythm with occasional PVCs. Gastrointestinal system: Abdomen is nondistended, soft and nontender. No organomegaly or masses felt. Normal bowel sounds heard. Central nervous system: Alert and oriented. No focal neurological deficits. Extremities: Symmetric 5 x 5 power. Skin: No rashes, lesions or ulcers Psychiatry: Judgement and insight appear normal. Mood & affect appropriate.     Data Reviewed: I have personally reviewed following labs and imaging studies  CBC: Recent Labs  Lab 05/11/18 1744 05/12/18 0540 05/12/18 1316 05/13/18 0817  WBC 5.3 3.8* 4.6 4.2  NEUTROABS 3.2  --   --   --   HGB 6.9* 7.9* 8.3* 7.8*  HCT 22.4* 24.2* 26.2* 24.4*  MCV 92.9 86.4 87.9 88.1  PLT 123* 101* 107* 112*   Basic Metabolic Panel: Recent Labs  Lab 05/11/18 1744 05/12/18 0540  NA 132* 136  K 3.4* 4.0  CL 103 106  CO2 21* 23  GLUCOSE 157* 95  BUN 33* 29*  CREATININE 1.01* 1.01*  CALCIUM 9.4 9.3   Coagulation Profile: Recent Labs  Lab 05/12/18 0540  INR 1.11   Cardiac Enzymes: Recent Labs  Lab 05/13/18 0817  TROPONINI <0.03       Radiology Studies: Ct Abdomen Pelvis Wo Contrast  Result Date: 05/11/2018 CLINICAL DATA:  Abdominal pain. EXAM: CT ABDOMEN AND PELVIS WITHOUT CONTRAST TECHNIQUE: Multidetector CT imaging of the abdomen and pelvis was performed following the standard protocol without IV contrast. COMPARISON:  None. FINDINGS: Lower chest: Coronary artery calcification is evident. Atherosclerotic calcification is noted in the wall of the thoracic aorta. 4 mm posterior right lower lobe pulmonary nodule evident. Hepatobiliary: No focal abnormality in the liver on this study without intravenous contrast. There is no evidence for gallstones, gallbladder wall thickening, or pericholecystic fluid. No intrahepatic or extrahepatic biliary dilation. Pancreas: No focal  mass lesion. No dilatation of the main duct. No intraparenchymal cyst. No peripancreatic edema. Spleen: No splenomegaly. No focal mass lesion. Adrenals/Urinary Tract: No adrenal nodule or mass. Kidneys unremarkable. No evidence for hydroureter. The urinary bladder appears normal for the degree of distention. Stomach/Bowel: Stomach is nondistended. No gastric wall thickening. No evidence of outlet obstruction. Duodenum is normally positioned as is the ligament of Treitz. No small bowel wall thickening. No small bowel dilatation. The terminal ileum is normal. The appendix is normal. No gross colonic mass. No colonic wall thickening. Vascular/Lymphatic: There is abdominal aortic atherosclerosis without aneurysm. There is no gastrohepatic or hepatoduodenal ligament lymphadenopathy. No intraperitoneal or retroperitoneal lymphadenopathy. No pelvic sidewall lymphadenopathy. Reproductive: Uterus surgically absent.  There is no adnexal mass. Other: No intraperitoneal free fluid. Musculoskeletal: Small bilateral groin hernias contain only fat the No worrisome lytic or sclerotic osseous abnormality. IMPRESSION: 1. No acute findings in the abdomen or pelvis. 2. 4 mm right lower lobe pulmonary nodule. No follow-up needed if patient is low-risk. Non-contrast chest CT can be considered in 12 months if patient is high-risk. This recommendation follows the consensus statement: Guidelines for Management of Incidental Pulmonary Nodules Detected on CT Images: From the Fleischner Society 2017; Radiology 2017; 284:228-243. 3.  Aortic Atherosclerois (ICD10-170.0) Electronically Signed   By: Minerva Areola  Molli Posey M.D.   On: 05/11/2018 20:52   Dg Chest 2 View  Result Date: 05/11/2018 CLINICAL DATA:  Chest pain and shortness of breath. EXAM: CHEST - 2 VIEW COMPARISON:  05/01/2018 FINDINGS: The lungs are clear without focal pneumonia, edema, pneumothorax or pleural effusion. The cardiopericardial silhouette is within normal limits for size. The  visualized bony structures of the thorax are intact. Telemetry leads overlie the chest. IMPRESSION: No active cardiopulmonary disease. Electronically Signed   By: Kennith Center M.D.   On: 05/11/2018 18:46        Scheduled Meds: . allopurinol  100 mg Oral Daily  . amLODipine  5 mg Oral Daily  . cholecalciferol  5,000 Units Oral Daily  . latanoprost  1 drop Both Eyes QHS  . multivitamin with minerals  1 tablet Oral Daily  . [START ON 05/15/2018] pantoprazole  40 mg Intravenous Q12H  . rosuvastatin  10 mg Oral Q48H  . umeclidinium-vilanterol  1 puff Inhalation Daily   Continuous Infusions: . sodium chloride 75 mL/hr at 05/13/18 0624  . pantoprozole (PROTONIX) infusion 8 mg/hr (05/13/18 0626)  . sodium chloride       LOS: 0 days     Marcellus Scott, MD, FACP, [facility]. [facility] Pager [phone]  If 7PM-7AM, please contact night-coverage [email or web address] Password [email or web address] 05/13/2018, 1:54 PM

## 2018-05-13 NOTE — Op Note (Signed)
[facility] Patient Name: Rebekah Macdonald Procedure Date : 05/13/2018 MRN: [id number] Attending MD: Willis Modena , MD Date of Birth: 22-Nov-1935 CSN: [id number] Age: 82 Admit Type: Inpatient Procedure:                Upper GI endoscopy Indications:              Acute post hemorrhagic anemia, Melena Providers:                Willis Modena, MD, Zoe Lan, RN, Verita Schneiders, Technician, Albertina Senegal. Beckner, CRNA Referring MD:             [facility] Medicines:                Monitored Anesthesia Care Complications:            No immediate complications. Estimated Blood Loss:     Estimated blood loss was minimal. Procedure:                Pre-Anesthesia Assessment:                           - Prior to the procedure, a History and Physical                            was performed, and patient medications and                            allergies were reviewed. The patient's tolerance of                            previous anesthesia was also reviewed. The risks                            and benefits of the procedure and the sedation                            options and risks were discussed with the patient.                            All questions were answered, and informed consent                            was obtained. Prior Anticoagulants: The patient has                            taken Eliquis (apixaban), last dose was 2 days                            prior to procedure. ASA Grade Assessment: III - A                            patient with severe systemic disease. After  reviewing the risks and benefits, the patient was                            deemed in satisfactory condition to undergo the                            procedure.                           After obtaining informed consent, the endoscope was                            passed under direct vision. Throughout the   procedure, the patient's blood pressure, pulse, and                            oxygen saturations were monitored continuously. The                            [id number]-[id number] ([id number]) Olympus Adult EGD was introduced                            through the mouth, and advanced to the third part                            of duodenum. The upper GI endoscopy was                            accomplished without difficulty. The patient                            tolerated the procedure well. Scope In: Scope Out: Findings:      A small hiatal hernia was present.      The exam of the esophagus was otherwise normal.      A few localized, diminutive non-bleeding erosions were found in the       prepyloric region of the stomach. There were no stigmata of recent       bleeding.      The exam of the stomach was otherwise normal.      A few small angioectasias with stigmata of recent bleeding were found in       the third portion of the duodenum. For hemostasis, three hemostatic       clips were successfully placed. There was no bleeding at the end of the       procedure.      A small non-bleeding diverticulum was found in the third portion of the       duodenum. Impression:               - Small hiatal hernia.                           - Non-bleeding erosive gastropathy.                           - A few recently bleeding angioectasias in the  distal duodenum. Clips were placed.                           - Non-bleeding duodenal diverticulum. Moderate Sedation:      None Recommendation:           - Clear liquid diet today.                           - No aspirin, ibuprofen, naproxen, or other                            non-steroidal anti-inflammatory drugs until further                            notice.                           - Advise no anticoagulants for the time being.                           - Use Protonix (pantoprazole) 40 mg IV daily until                             further notice.                           - If rebleeding/recurrent bleeding, would consider                            IR consultation for angiogram (clips placed today                            would mark the spot of the bleeding).                           - Follow CBC.                           - [facility] will follow. Procedure Code(s):        --- Professional ---                           [phone], Esophagogastroduodenoscopy, flexible,                            transoral; with control of bleeding, any method Diagnosis Code(s):        --- Professional ---                           K44.9, Diaphragmatic hernia without obstruction or                            gangrene                           K31.89, Other diseases of stomach and duodenum  K31.811, Angiodysplasia of stomach and duodenum                            with bleeding                           D62, Acute posthemorrhagic anemia                           K92.1, Melena (includes Hematochezia)                           K57.10, Diverticulosis of small intestine without                            perforation or abscess without bleeding CPT copyright 2018 American Medical Association. All rights reserved. The codes documented in this report are preliminary and upon coder review may  be revised to meet current compliance requirements. Willis Modena, MD 05/13/2018 1:14:00 PM This report has been signed electronically. Number of Addenda: 0

## 2018-05-13 NOTE — Anesthesia Procedure Notes (Signed)
Procedure Name: MAC Date/Time: 05/13/2018 12:25 PM Performed by: Mariea Clonts, CRNA Pre-anesthesia Checklist: Patient identified, Emergency Drugs available, Suction available, Patient being monitored and Timeout performed Patient Re-evaluated:Patient Re-evaluated prior to induction Oxygen Delivery Method: Nasal cannula

## 2018-05-13 NOTE — Anesthesia Preprocedure Evaluation (Addendum)
Anesthesia Evaluation  Patient identified by MRN, date of birth, ID band Patient awake    Reviewed: Allergy & Precautions, NPO status , Patient's Chart, lab work & pertinent test results  History of Anesthesia Complications Negative for: history of anesthetic complications  Airway Mallampati: II  TM Distance: >3 FB Neck ROM: Full    Dental  (+) Edentulous Upper, Edentulous Lower   Pulmonary COPD,  COPD inhaler, former smoker (quit 1989), PE   breath sounds clear to auscultation       Cardiovascular hypertension, Pt. on medications (-) angina Rhythm:Regular Rate:Normal     Neuro/Psych negative neurological ROS     GI/Hepatic Neg liver ROS, GERD  Medicated and Controlled,  Endo/Other  obesity  Renal/GU Renal InsufficiencyRenal disease     Musculoskeletal   Abdominal (+) + obese,   Peds  Hematology  (+) Blood dyscrasia (Hb 7.8, plt 112k), anemia , eliquis   Anesthesia Other Findings   Reproductive/Obstetrics                            Anesthesia Physical Anesthesia Plan  ASA: III  Anesthesia Plan: MAC   Post-op Pain Management:    Induction:   PONV Risk Score and Plan: 2 and Treatment may vary due to age or medical condition  Airway Management Planned: Nasal Cannula and Natural Airway  Additional Equipment:   Intra-op Plan:   Post-operative Plan:   Informed Consent: I have reviewed the patients History and Physical, chart, labs and discussed the procedure including the risks, benefits and alternatives for the proposed anesthesia with the patient or authorized representative who has indicated his/her understanding and acceptance.     Plan Discussed with: CRNA and Surgeon  Anesthesia Plan Comments: (Plan routine monitors, MAC)        Anesthesia Quick Evaluation

## 2018-05-13 NOTE — Interval H&P Note (Signed)
History and Physical Interval Note:  05/13/2018 12:14 PM  Rebekah Macdonald  has presented today for surgery, with the diagnosis of GI bleed  The various methods of treatment have been discussed with the patient and family. After consideration of risks, benefits and other options for treatment, the patient has consented to  Procedure(s): ESOPHAGOGASTRODUODENOSCOPY (EGD) WITH PROPOFOL (N/A) as a surgical intervention .  The patient's history has been reviewed, patient examined, no change in status, stable for surgery.  I have reviewed the patient's chart and labs.  Questions were answered to the patient's satisfaction.     Rebekah Macdonald  Assessment:  1.  Melena. 2.  Anemia. 3.  Anticoagulation use for pulmonary embolism (apixaban), last dose 48 hours ago.  Plan:  1.  Endoscopy. 2.  Risks (bleeding, infection, bowel perforation that could require surgery, sedation-related changes in cardiopulmonary systems), benefits (identification and possible treatment of source of symptoms, exclusion of certain causes of symptoms), and alternatives (watchful waiting, radiographic imaging studies, empiric medical treatment) of upper endoscopy (EGD) were explained to patient/family in detail and patient wishes to proceed.

## 2018-05-13 NOTE — Anesthesia Postprocedure Evaluation (Signed)
Anesthesia Post Note  Patient: Rebekah Macdonald  Procedure(s) Performed: ESOPHAGOGASTRODUODENOSCOPY (EGD) WITH PROPOFOL (N/A ) HEMOSTASIS CLIP PLACEMENT     Patient location during evaluation: Endoscopy Anesthesia Type: MAC Level of consciousness: oriented, patient cooperative and sedated Pain management: pain level controlled Vital Signs Assessment: post-procedure vital signs reviewed and stable Respiratory status: spontaneous breathing, nonlabored ventilation, respiratory function stable and patient connected to nasal cannula oxygen Cardiovascular status: blood pressure returned to baseline and stable Postop Assessment: no apparent nausea or vomiting Anesthetic complications: no    Last Vitals:  Vitals:   05/13/18 1310 05/13/18 1320  BP: (!) 132/56 127/67  Pulse: 81 80  Resp: 18 15  Temp:    SpO2: 100% 100%    Last Pain:  Vitals:   05/13/18 1320  TempSrc:   PainSc: 0-No pain                 Aleksandra Raben,E. Jacob Cicero

## 2018-05-13 NOTE — Transfer of Care (Signed)
Immediate Anesthesia Transfer of Care Note  Patient: Rebekah Macdonald  Procedure(s) Performed: ESOPHAGOGASTRODUODENOSCOPY (EGD) WITH PROPOFOL (N/A ) HEMOSTASIS CLIP PLACEMENT  Patient Location: Endoscopy Unit  Anesthesia Type:MAC  Level of Consciousness: awake, alert  and oriented  Airway & Oxygen Therapy: Patient Spontanous Breathing and Patient connected to nasal cannula oxygen  Post-op Assessment: Report given to RN, Post -op Vital signs reviewed and stable and Patient moving all extremities X 4  Post vital signs: Reviewed and stable  Last Vitals:  Vitals Value Taken Time  BP 132/56 05/13/2018  1:10 PM  Temp 36.4 C 05/13/2018  1:08 PM  Pulse 81 05/13/2018  1:10 PM  Resp 16 05/13/2018  1:14 PM  SpO2 100 % 05/13/2018  1:10 PM  Vitals shown include unvalidated device data.  Last Pain:  Vitals:   05/13/18 1310  TempSrc:   PainSc: 0-No pain         Complications: No apparent anesthesia complications

## 2018-05-14 LAB — CBC
HCT: 22.4 % — ABNORMAL LOW (ref 36.0–46.0)
HCT: 29 % — ABNORMAL LOW (ref 36.0–46.0)
Hemoglobin: 7 g/dL — ABNORMAL LOW (ref 12.0–15.0)
Hemoglobin: 9.3 g/dL — ABNORMAL LOW (ref 12.0–15.0)
MCH: 27.8 pg (ref 26.0–34.0)
MCH: 29.1 pg (ref 26.0–34.0)
MCHC: 31.3 g/dL (ref 30.0–36.0)
MCHC: 32.1 g/dL (ref 30.0–36.0)
MCV: 88.9 fL (ref 80.0–100.0)
MCV: 90.6 fL (ref 80.0–100.0)
NRBC: 0 % (ref 0.0–0.2)
PLATELETS: 109 10*3/uL — AB (ref 150–400)
PLATELETS: 121 10*3/uL — AB (ref 150–400)
RBC: 2.52 MIL/uL — ABNORMAL LOW (ref 3.87–5.11)
RBC: 3.2 MIL/uL — ABNORMAL LOW (ref 3.87–5.11)
RDW: 15.9 % — AB (ref 11.5–15.5)
RDW: 15.1 % (ref 11.5–15.5)
WBC: 3.7 10*3/uL — ABNORMAL LOW (ref 4.0–10.5)
WBC: 4 10*3/uL (ref 4.0–10.5)
nRBC: 0 % (ref 0.0–0.2)

## 2018-05-14 LAB — PREPARE RBC (CROSSMATCH)

## 2018-05-14 MED ORDER — SODIUM CHLORIDE 0.9% IV SOLUTION: Freq: Once | INTRAVENOUS | Status: DC

## 2018-05-14 NOTE — Plan of Care (Signed)
  Problem: Health Behavior/Discharge Planning: Goal: Ability to manage health-related needs will improve Outcome: Progressing   Problem: Education: Goal: Knowledge of General Education information will improve Description: Including pain rating scale, medication(s)/side effects and non-pharmacologic comfort measures Outcome: Progressing   Problem: Clinical Measurements: Goal: Ability to maintain clinical measurements within normal limits will improve Outcome: Progressing   

## 2018-05-14 NOTE — Progress Notes (Signed)
Subjective: Drop hgb without blood in stool.  Objective: Vital signs in last 24 hours: Temp:  [97.4 F (36.3 C)-99.7 F (37.6 C)] 99.7 F (37.6 C) (11/19 1442) Pulse Rate:  [64-81] 81 (11/19 1442) Resp:  [18] 18 (11/19 1442) BP: (116-156)/(50-79) 125/79 (11/19 1442) SpO2:  [97 %-100 %] 100 % (11/19 1442) Weight:  [88.8 kg] 88.8 kg (11/19 0418) Weight change: -0.59 kg Last BM Date: 05/14/18  PE: GEN:  NAD ABD:  Soft  Lab Results CBC    Component Value Date/Time   WBC 3.7 (L) 05/14/2018 0412   RBC 2.52 (L) 05/14/2018 0412   HGB 7.0 (L) 05/14/2018 0412   HCT 22.4 (L) 05/14/2018 0412   PLT 109 (L) 05/14/2018 0412   MCV 88.9 05/14/2018 0412   MCH 27.8 05/14/2018 0412   MCHC 31.3 05/14/2018 0412   RDW 15.9 (H) 05/14/2018 0412   LYMPHSABS 1.4 05/11/2018 1744   MONOABS 0.5 05/11/2018 1744   EOSABS 0.1 05/11/2018 1744   BASOSABS 0.0 05/11/2018 1744   CMP     Component Value Date/Time   NA 136 05/12/2018 0540   K 4.0 05/12/2018 0540   CL 106 05/12/2018 0540   CO2 23 05/12/2018 0540   GLUCOSE 95 05/12/2018 0540   BUN 29 (H) 05/12/2018 0540   CREATININE 1.01 (H) 05/12/2018 0540   CALCIUM 9.3 05/12/2018 0540   PROT 7.2 05/01/2018 2040   ALBUMIN 4.0 05/01/2018 2040   AST 28 05/01/2018 2040   ALT 17 05/01/2018 2040   ALKPHOS 38 05/01/2018 2040   BILITOT 0.4 05/01/2018 2040   GFRNONAA 51 (L) 05/12/2018 0540   GFRAA 59 (L) 05/12/2018 0540    Assessment:  1. GI bleed, duodenal avm bleed, endoscopy with clips done. 2.  Acute blood loss anemia. 3.  Recent small pulmonary embolism.  Plan:  1.  Advance diet. 2.  Continue supportive care. 3.  If ongoing anemia and blood in stool, consider IR consultation for consideration of angiography. 4.  Anticoagulation on hold. 5.  [facility] will follow.   Rebekah Macdonald,Rebekah Macdonald 05/14/2018, 2:57 PM   Cell [phone] If no answer or after 5 PM call [phone]

## 2018-05-14 NOTE — Progress Notes (Signed)
PROGRESS NOTE   Rebekah Macdonald  ZOX:[id number]    DOB: [birth year]    DOA: 05/11/2018  PCP: [name], PA-C   I have briefly reviewed patients previous medical records in [facility].  Brief Narrative:  82 year old female with PMH of PAH by CT scan, chronic anemia with baseline approximately 10 g per DL, chronic pancytopenia-nontransfusion dependent, mild COPD based on PFTs 2019, untreated OSA-reports intolerance to CPAP, HTN, chronic arthritis on acetaminophen, hyperlipidemia, gout, recent hospitalization 05/01/2018-05/03/2018 when she was admitted for worsening dyspnea and intermittent chest pressure with palpitations for which she had been seen by outpatient pulmonology resulting in cardiology consultation, TEE 03/2017 showed LVEF 70% and RVSP 33 mmHg, HRCT 04/13/2018 showed emphysema and enlarged pulmonary artery, started on outpatient Spiriva, during recent hospitalization CTA chest suggested possible 2 small segmental/subsegmental pulmonary emboli and it was felt that her acute on subacute/chronic dyspnea was likely multifactorial due to untreated OSA in the setting of chronic anemia, mild COPD and newly diagnosed PE contributed to worsening dyspnea, she was started on Eliquis, now returned with 3 days of melena without hematochezia, hematemesis, nausea or vomiting.  She was admitted for acute upper GI bleed and acute blood loss anemia in context of newly started Eliquis.  GI consulted and underwent EGD 11/18 with findings as below.  IR recommended no anticoagulation or IVC filter after reviewing CTA chest results of 11/6 where they felt that the findings were not convincing for significant PE.   Assessment & Plan:   Principal Problem:   GIB (gastrointestinal bleeding) Active Problems:   Pulmonary embolism (HCC)   COPD, mild (HCC)   HLD (hyperlipidemia)   Essential hypertension   Gout   Pancytopenia (HCC)   Symptomatic anemia   Hypokalemia   CKD (chronic kidney disease),  stage III (HCC)   Pulmonary nodule   Acute upper GI bleeding   Acute upper GI bleed: [facility] was consulted.  She reported history of PUD and bleeding ulcer diagnosed by EGD 8 to 10 years ago, reported normal colonoscopy 2018.  Denied NSAIDs or alcohol use.  Eliquis was obviously held, last dose 11/16.  Treated with Protonix drip, bowel rest/clear liquids.  EGD performed 11/18, detailed report as below but in summary showed nonbleeding erosive gastropathy, a few recently bleeding angiectasia is in the distal duodenum for which clips were placed.  I discussed with Dr. Dulce Sellar, GI on 11/18 who recommended no anticoagulants for several weeks, remains at high risk for rebleeding if anticoagulation resumed, if rebleeding/recurrent bleeding occurs then consider IR consultation for angiogram.  And pelvis were negative for acute abdominal abnormalities.  No BM since EGD.  Hemoglobin however has dropped from 8 to 7 g, suspect equilibrating and not active bleeding.  As discussed with GI today, advance diet but monitor in-house overnight.  Acute blood loss anemia complicating chronic anemia: Hemoglobin at recent discharge on 11/7: 9.6.  Presented with hemoglobin of 6.9.  Secondary to GI bleed.  Transfused 2 units of PRBCs.  Hemoglobin improved was stable in the 7.8-8 range yesterday but has dropped to 7 g this morning despite no overt clinical bleeding.  Patient symptomatic.  Will transfuse additional unit PRBC and follow CBC closely.  Transfuse if hemoglobin drops <7.5.  Abnormal CTA chest 05/01/2018 (reported tiny acute PE Vs artifact: CTA chest 05/01/2018 showed 2 possible tiny right lower lobe segmental/subsegmental emboli and no occlusive or more proximal embolus.  Eliquis had been started.  Eliquis currently discontinued due to GI bleed and acute  blood loss anemia.  Lower extremity venous Dopplers negative for DVT.  SCDs placed.  As per my discussion with [facility] and a pulmonologist on 11/18, consulted IR for  retrievable IVC filter placement due to high risk for recurrent GI bleeding on anticoagulation.  After order for IVC filter was placed, Dr. Jetty Peeks, IR called me back to discuss.  He reviewed CTA chest images from 05/01/2018 and indicated that findings were either of minuscule acute PE versus artifact and he would not have recommended anticoagulation and hence does not seem a role for IVC filter at this time.  Plans for IVC filter canceled at this time.  Atypical chest pain: Noted on 11/18.  Resolved spontaneously.  Suspect GI in origin and less likely ACS or due to PE.  Hemodynamically stable and not hypoxic.  EKG without acute changes.  Troponin was cycled and negative.  PPI and Maalox as needed.  No recurrence of chest pain.  Hyperlipidemia: Crestor.  COPD: Stable without clinical bronchospasm.  Essential hypertension: Controlled.  Diovan HCTZ held.  Continue amlodipine.  Gout: No flare.  Continue allopurinol.  Pancytopenia: Anemia transfused as reported above.  Thrombocytopenia likely related to acute blood loss, stable.  Leukopenia mild and intermittent.  Reportedly follows with hematologist Dr. Neil Crouch as outpatient.  Hypokalemia: Replaced.  I do not think that patient had acute kidney injury.  Pulmonary nodule: Noted on CT abdomen and pelvis this admission, 4 mm right lower lobe pulmonary nodule.  Outpatient follow-up with PCP.  Pulmonary artery hypertension: As per recent discharge summary 11/8, uncertain if PAH seen on CT is related to chronic thromboembolic disease pulmonary hypertension or more so from untreated OSA.  Anticoagulation discussion as above.  Outpatient follow-up with pulmonology.  GERD: EGD findings as below.  Continue PPI.  OSA: Intolerant of CPAP.  Outpatient follow-up.   DVT prophylaxis: SCDs. Code Status: Partial/DNI Family Communication: I discussed in detail with patient and her daughter at bedside, updated care and answered questions.  They are  agreeable to blood transfusion today. Disposition: DC home pending clinical improvement.  She needs additional blood transfusion for symptomatic anemia and close monitoring overnight for recurrent GI bleeding.   Consultants:  [facility]. Interventional radiology.  Procedures:  EGD 05/13/2018  Impression: - Small hiatal hernia. - Non-bleeding erosive gastropathy. - A few recently bleeding angioectasias in the distal duodenum. Clips were placed. - Non-bleeding duodenal diverticulum.  Recommendation: - Clear liquid diet today. - No aspirin, ibuprofen, naproxen, or other non-steroidal anti-inflammatory drugs until further notice. - Advise no anticoagulants for the time being. - Use Protonix (pantoprazole) 40 mg IV daily until further notice. - If rebleeding/recurrent bleeding, would consider IR consultation for angiogram (clips placed today would mark the spot of the bleeding). - Follow CBC. - [facility] will follow.   Antimicrobials:  None.   Subjective: No BM since EGD yesterday.  Ongoing epigastric/left upper quadrant intermittent pain, some relief with Maalox.  Asking for regular food.  Mild intermittent dizziness in upright position but no chest pain, dyspnea.  ROS: As above, otherwise negative.  Objective:  Vitals:   05/14/18 0802 05/14/18 0825 05/14/18 1024 05/14/18 1046  BP: 129/60  (!) 136/56 (!) 156/70  Pulse: 70 72 64 64  Resp:  18 18 18   Temp:   98.3 F (36.8 C) 98.2 F (36.8 C)  TempSrc:   Oral Oral  SpO2:  98% 100% 100%  Weight:      Height:        Examination:  General exam: Pleasant elderly female, moderately built and nourished sitting up comfortably in bed. Respiratory system: Clear to auscultation. Respiratory effort normal.  Stable. Cardiovascular system: S1 & S2 heard, RRR. No JVD, murmurs, rubs, gallops or clicks. No pedal edema.  Telemetry personally reviewed: Sinus rhythm with occasional PVCs. Gastrointestinal system: Abdomen is nondistended,  soft.  Minimal epigastric tenderness without peritoneal signs.  No organomegaly or masses appreciated.  Normal bowel sounds heard. Central nervous system: Alert and oriented. No focal neurological deficits.  Stable. Extremities: Symmetric 5 x 5 power.  Stable. Skin: No rashes, lesions or ulcers Psychiatry: Judgement and insight appear normal. Mood & affect appropriate.     Data Reviewed: I have personally reviewed following labs and imaging studies  CBC: Recent Labs  Lab 05/11/18 1744 05/12/18 0540 05/12/18 1316 05/13/18 0817 05/13/18 1853 05/14/18 0412  WBC 5.3 3.8* 4.6 4.2 6.1 3.7*  NEUTROABS 3.2  --   --   --   --   --   HGB 6.9* 7.9* 8.3* 7.8* 8.0* 7.0*  HCT 22.4* 24.2* 26.2* 24.4* 24.8* 22.4*  MCV 92.9 86.4 87.9 88.1 87.9 88.9  PLT 123* 101* 107* 112* 120* 109*   Basic Metabolic Panel: Recent Labs  Lab 05/11/18 1744 05/12/18 0540  NA 132* 136  K 3.4* 4.0  CL 103 106  CO2 21* 23  GLUCOSE 157* 95  BUN 33* 29*  CREATININE 1.01* 1.01*  CALCIUM 9.4 9.3   Coagulation Profile: Recent Labs  Lab 05/12/18 0540  INR 1.11   Cardiac Enzymes: Recent Labs  Lab 05/13/18 0817 05/13/18 1425 05/13/18 1853  TROPONINI <0.03 <0.03 <0.03       Radiology Studies: Vas Koreas Lower Extremity Venous (dvt)  Result Date: 05/13/2018  Lower Venous Study Other             Patient with recent PE found last week, now back with a GI Indications:      bleed. Performing Technologist: Sherren Kernsandace Kanady RVS  Examination Guidelines: A complete evaluation includes B-mode imaging, spectral Doppler, color Doppler, and power Doppler as needed of all accessible portions of each vessel. Bilateral testing is considered an integral part of a complete examination. Limited examinations for reoccurring indications may be performed as noted.  Right Venous Findings: +---------+---------------+---------+-----------+----------+-------+          CompressibilityPhasicitySpontaneityPropertiesSummary  +---------+---------------+---------+-----------+----------+-------+ CFV      Full           Yes      Yes                          +---------+---------------+---------+-----------+----------+-------+ SFJ      Full                                                 +---------+---------------+---------+-----------+----------+-------+ FV Prox  Full                                                 +---------+---------------+---------+-----------+----------+-------+ FV Mid   Full                                                 +---------+---------------+---------+-----------+----------+-------+  FV DistalFull                                                 +---------+---------------+---------+-----------+----------+-------+ PFV      Full                                                 +---------+---------------+---------+-----------+----------+-------+ POP      Full           Yes      Yes                          +---------+---------------+---------+-----------+----------+-------+ PTV      Full                                                 +---------+---------------+---------+-----------+----------+-------+ PERO     Full                                                 +---------+---------------+---------+-----------+----------+-------+  Left Venous Findings: +---------+---------------+---------+-----------+----------+-------+          CompressibilityPhasicitySpontaneityPropertiesSummary +---------+---------------+---------+-----------+----------+-------+ CFV      Full           Yes      Yes                          +---------+---------------+---------+-----------+----------+-------+ SFJ      Full                                                 +---------+---------------+---------+-----------+----------+-------+ FV Prox  Full                                                  +---------+---------------+---------+-----------+----------+-------+ FV Mid   Full                                                 +---------+---------------+---------+-----------+----------+-------+ FV DistalFull                                                 +---------+---------------+---------+-----------+----------+-------+ PFV      Full                                                 +---------+---------------+---------+-----------+----------+-------+   POP      Full           Yes      Yes                          +---------+---------------+---------+-----------+----------+-------+ PTV      Full                                                 +---------+---------------+---------+-----------+----------+-------+ PERO     Full                                                 +---------+---------------+---------+-----------+----------+-------+    Summary: Right: There is no evidence of deep vein thrombosis in the lower extremity. Left: There is no evidence of deep vein thrombosis in the lower extremity.  *See table(s) above for measurements and observations. Electronically signed by Lemar Livings MD on 05/13/2018 at 4:23:24 PM.    Final         Scheduled Meds: . sodium chloride   Intravenous Once  . allopurinol  100 mg Oral Daily  . amLODipine  5 mg Oral Daily  . cholecalciferol  5,000 Units Oral Daily  . latanoprost  1 drop Both Eyes QHS  . multivitamin with minerals  1 tablet Oral Daily  . [START ON 05/15/2018] pantoprazole  40 mg Intravenous Q12H  . rosuvastatin  10 mg Oral Q48H  . umeclidinium-vilanterol  1 puff Inhalation Daily   Continuous Infusions: . pantoprozole (PROTONIX) infusion Stopped (05/14/18 1025)     LOS: 1 day     Marcellus Scott, MD, FACP, [facility]. [facility] Pager [phone]  If 7PM-7AM, please contact night-coverage [email or web address] Password [email or web address] 05/14/2018, 11:38 AM

## 2018-05-14 NOTE — Progress Notes (Signed)
Patient did not want another IV for blood administration. Protonix paused.

## 2018-05-14 NOTE — Progress Notes (Signed)
Merdis DelayK. Schorr NP notified of pt's hgb=9.3 post transfusion.

## 2018-05-15 ENCOUNTER — Encounter (HOSPITAL_COMMUNITY): Payer: Self-pay | Admitting: Gastroenterology

## 2018-05-15 DIAGNOSIS — N183 Chronic kidney disease, stage 3 (moderate): Secondary | ICD-10-CM

## 2018-05-15 DIAGNOSIS — K922 Gastrointestinal hemorrhage, unspecified: Secondary | ICD-10-CM

## 2018-05-15 DIAGNOSIS — J449 Chronic obstructive pulmonary disease, unspecified: Secondary | ICD-10-CM

## 2018-05-15 DIAGNOSIS — E876 Hypokalemia: Secondary | ICD-10-CM

## 2018-05-15 DIAGNOSIS — I10 Essential (primary) hypertension: Secondary | ICD-10-CM

## 2018-05-15 DIAGNOSIS — E785 Hyperlipidemia, unspecified: Secondary | ICD-10-CM

## 2018-05-15 LAB — CBC
HCT: 26.6 % — ABNORMAL LOW (ref 36.0–46.0)
Hemoglobin: 8.6 g/dL — ABNORMAL LOW (ref 12.0–15.0)
MCH: 28.9 pg (ref 26.0–34.0)
MCHC: 32.3 g/dL (ref 30.0–36.0)
MCV: 89.3 fL (ref 80.0–100.0)
Platelets: 120 10*3/uL — ABNORMAL LOW (ref 150–400)
RBC: 2.98 MIL/uL — ABNORMAL LOW (ref 3.87–5.11)
RDW: 15.1 % (ref 11.5–15.5)
WBC: 4.1 10*3/uL (ref 4.0–10.5)
nRBC: 0 % (ref 0.0–0.2)

## 2018-05-15 LAB — BASIC METABOLIC PANEL
Anion gap: 7 (ref 5–15)
BUN: 9 mg/dL (ref 8–23)
CALCIUM: 9 mg/dL (ref 8.9–10.3)
CO2: 23 mmol/L (ref 22–32)
CREATININE: 1.12 mg/dL — AB (ref 0.44–1.00)
Chloride: 105 mmol/L (ref 98–111)
GFR calc non Af Amer: 45 mL/min — ABNORMAL LOW (ref >60)
GFR, EST AFRICAN AMERICAN: 52 mL/min — AB (ref >60)
Glucose, Bld: 93 mg/dL (ref 70–99)
Potassium: 3.8 mmol/L (ref 3.5–5.1)
Sodium: 135 mmol/L (ref 135–145)

## 2018-05-15 LAB — BPAM RBC
Blood Product Expiration Date: 201912032359
Blood Product Expiration Date: 201912072359
Blood Product Expiration Date: 201912082359
ISSUE DATE / TIME: 201911162055
ISSUE DATE / TIME: 201911170020
ISSUE DATE / TIME: 201911191014
Unit Type and Rh: 8400
Unit Type and Rh: 8400
Unit Type and Rh: 8400

## 2018-05-15 LAB — TYPE AND SCREEN
ABO/RH(D): AB POS
ANTIBODY SCREEN: NEGATIVE
UNIT DIVISION: 0
UNIT DIVISION: 0
Unit division: 0

## 2018-05-15 MED ORDER — SUCRALFATE 1 G PO TABS
1.0000 g | ORAL_TABLET | Freq: Three times a day (TID) | ORAL | Status: DC
  Administered 2018-05-15 – 2018-05-17 (×8): 1 g via ORAL
  Filled 2018-05-15 (×8): qty 1

## 2018-05-15 NOTE — Progress Notes (Signed)
Subjective: Some dark stools. Tolerating diet.  Objective: Vital signs in last 24 hours: Temp:  [98 F (36.7 C)-98.9 F (37.2 C)] 98 F (36.7 C) (11/20 0552) Pulse Rate:  [69-76] 74 (11/20 0552) Resp:  [16-18] 16 (11/20 0552) BP: (130-148)/(54-65) 130/54 (11/20 0552) SpO2:  [94 %-100 %] 94 % (11/20 0738) Weight:  [89.3 kg] 89.3 kg (11/20 0056) Weight change: 0.544 kg Last BM Date: 05/14/18  PE: GEN:  NAD ABD:  Soft, protuberant, non-tender  Lab Results: CBC    Component Value Date/Time   WBC 4.1 05/15/2018 0400   RBC 2.98 (L) 05/15/2018 0400   HGB 8.6 (L) 05/15/2018 0400   HCT 26.6 (L) 05/15/2018 0400   PLT 120 (L) 05/15/2018 0400   MCV 89.3 05/15/2018 0400   MCH 28.9 05/15/2018 0400   MCHC 32.3 05/15/2018 0400   RDW 15.1 05/15/2018 0400   LYMPHSABS 1.4 05/11/2018 1744   MONOABS 0.5 05/11/2018 1744   EOSABS 0.1 05/11/2018 1744   BASOSABS 0.0 05/11/2018 1744    Assessment:  1. GI bleed, duodenal avm bleed, endoscopy with clips done. 2.  Acute blood loss anemia. 3.  Recent small pulmonary embolism.  Plan:  1.  Advancing diet. 2.  Follow Hgb. 3.  If ongoing anemia and blood in stool, consider IR consultation for consideration of angiography. 4.  Anticoagulation on hold. 5.  [facility] will follow.   Freddy JakschOUTLAW,Kayron Kalmar M 05/15/2018, 2:50 PM   Cell [phone] If no answer or after 5 PM call [phone]

## 2018-05-15 NOTE — Progress Notes (Signed)
PROGRESS NOTE    Kaycee Haycraft  ZOX:[id number] DOB: 1935/06/27 DOA: 05/11/2018 PCP: [name], PA-C    Brief Narrative:  82 year old female who presented with dark stools, generalized weakness, epigastric abdominal pain and dyspnea.  She does have significant past medical history for peptic ulcer disease, hypertension, dyslipidemia, COPD, GERD, pancytopenia, chronic kidney disease stage III pulmonary embolism.  Large amounts of dark stool associated with epigastric abdominal pain, no nausea or vomiting.  Physical examination blood pressure 121/57, heart rate 79, respirate 12, temperature 98.9, oxygen saturation 98%.  Heart S1-S2 present and rhythmic, lungs clear to auscultation bilaterally, abdomen tender in the epigastric area, no lower extremity edema.  Patient was admitted to the hospital with the working diagnosis of acute blood loss anemia due to upper GI bleed.  Assessment & Plan:   Principal Problem:   GIB (gastrointestinal bleeding) Active Problems:   Pulmonary embolism (HCC)   COPD, mild (HCC)   HLD (hyperlipidemia)   Essential hypertension   Gout   Pancytopenia (HCC)   Symptomatic anemia   Hypokalemia   CKD (chronic kidney disease), stage III (HCC)   Pulmonary nodule   Acute upper GI bleeding   1. Acute upper GI bleed due to duodenal avm bleed (sp clips) complicated with acute blood loss anemia. Patient continue to have dyspepsia, sp PRBC last night and this am, hb up to 9,3 and 8,6. Will continue H&H monitoring. Will add sucralfate and will continue BID pantoprazole IV. No nausea or vomiting. Sp total of 3 units PRBC transfusion.   2.  Subsegmental pulmonary embolism. No indication for treatment, per guideline recommendations. Possible artifact. Will not resume anticoagulation.   3. COPD with pulmonary HTN. No signs of exacerbation, will continue oxymetry monitoring and supplemental 02 per [place]. As needed bronchodilator therapy.   4. HTN. Continue blood  pressure control with amlodipine, continue holding hctz and valsartan combination.    DVT prophylaxis: scd   Code Status:  dnr  Family Communication: I spoke with patient's daughter at the bedside and all questions were addressed.  Disposition Plan/ discharge barriers: pending clinical improvement, and physical therapy evaluation.   Body mass index is 33.8 kg/m. Malnutrition Type:      Malnutrition Characteristics:      Nutrition Interventions:     RN Pressure Injury Documentation:     Consultants:  GI  Procedures:     Antimicrobials:       Subjective: Patient continue to have dyspepsia, moderate in intensity, associated with left upper quadrant abdominal pain and dark stools, mild improvement with antiacids, no worsening factors.   Objective: Vitals:   05/14/18 2001 05/15/18 0056 05/15/18 0552 05/15/18 0738  BP: (!) 136/54 (!) 148/65 (!) 130/54   Pulse: 69 76 74   Resp: 18 18 16    Temp: 98.4 F (36.9 C) 98.9 F (37.2 C) 98 F (36.7 C)   TempSrc: Oral Oral Oral   SpO2: 100% 100% 100% 94%  Weight:  89.3 kg    Height:        Intake/Output Summary (Last 24 hours) at 05/15/2018 1222 Last data filed at 05/15/2018 1034 Gross per 24 hour  Intake 948.12 ml  Output 1550 ml  Net -601.88 ml   Filed Weights   05/13/18 0445 05/14/18 0418 05/15/18 0056  Weight: 89.4 kg 88.8 kg 89.3 kg    Examination:   General: deconditioned and ill looking appearing  Neurology: Awake and alert, non focal  E ENT: positive pallor, no icterus, oral mucosa moist  Cardiovascular: No JVD. S1-S2 present, rhythmic, no gallops, rubs, or murmurs. No lower extremity edema. Pulmonary: decreased breath sounds bilaterally, poor air movement, no wheezing, rhonchi or rales. Gastrointestinal. Abdomen with no organomegaly, mild distended, no rebound or guarding, but positive pain in the left upper quadrant abdominal pain.  Skin. No rashes Musculoskeletal: no joint  deformities     Data Reviewed: I have personally reviewed following labs and imaging studies  CBC: Recent Labs  Lab 05/11/18 1744  05/13/18 0817 05/13/18 1853 05/14/18 0412 05/14/18 2025 05/15/18 0400  WBC 5.3   < > 4.2 6.1 3.7* 4.0 4.1  NEUTROABS 3.2  --   --   --   --   --   --   HGB 6.9*   < > 7.8* 8.0* 7.0* 9.3* 8.6*  HCT 22.4*   < > 24.4* 24.8* 22.4* 29.0* 26.6*  MCV 92.9   < > 88.1 87.9 88.9 90.6 89.3  PLT 123*   < > 112* 120* 109* 121* 120*   < > = values in this interval not displayed.   Basic Metabolic Panel: Recent Labs  Lab 05/11/18 1744 05/12/18 0540 05/15/18 0400  NA 132* 136 135  K 3.4* 4.0 3.8  CL 103 106 105  CO2 21* 23 23  GLUCOSE 157* 95 93  BUN 33* 29* 9  CREATININE 1.01* 1.01* 1.12*  CALCIUM 9.4 9.3 9.0   GFR: Estimated Creatinine Clearance: 43.3 mL/min (A) (by C-G formula based on SCr of 1.12 mg/dL (H)). Liver Function Tests: No results for input(s): AST, ALT, ALKPHOS, BILITOT, PROT, ALBUMIN in the last 168 hours. No results for input(s): LIPASE, AMYLASE in the last 168 hours. No results for input(s): AMMONIA in the last 168 hours. Coagulation Profile: Recent Labs  Lab 05/12/18 0540  INR 1.11   Cardiac Enzymes: Recent Labs  Lab 05/13/18 0817 05/13/18 1425 05/13/18 1853  TROPONINI <0.03 <0.03 <0.03   BNP (last 3 results) No results for input(s): PROBNP in the last 8760 hours. HbA1C: No results for input(s): HGBA1C in the last 72 hours. CBG: No results for input(s): GLUCAP in the last 168 hours. Lipid Profile: No results for input(s): CHOL, HDL, LDLCALC, TRIG, CHOLHDL, LDLDIRECT in the last 72 hours. Thyroid Function Tests: No results for input(s): TSH, T4TOTAL, FREET4, T3FREE, THYROIDAB in the last 72 hours. Anemia Panel: No results for input(s): VITAMINB12, FOLATE, FERRITIN, TIBC, IRON, RETICCTPCT in the last 72 hours.    Radiology Studies: I have reviewed all of the imaging during this hospital visit  personally     Scheduled Meds: . sodium chloride   Intravenous Once  . allopurinol  100 mg Oral Daily  . amLODipine  5 mg Oral Daily  . cholecalciferol  5,000 Units Oral Daily  . latanoprost  1 drop Both Eyes QHS  . multivitamin with minerals  1 tablet Oral Daily  . pantoprazole  40 mg Intravenous Q12H  . rosuvastatin  10 mg Oral Q48H  . umeclidinium-vilanterol  1 puff Inhalation Daily   Continuous Infusions:   LOS: 2 days        Mauricio Annett Gulaaniel Arrien, MD [facility] Pager [phone]

## 2018-05-15 NOTE — Progress Notes (Addendum)
Pt and daughter asked for a bigger room. No rooms available at this time

## 2018-05-15 NOTE — Progress Notes (Addendum)
Patient is alert and oriented short of breath, 02 sat 100% and 95% walking. Gave 2 l 02 for comfort, gave patient teach on hgb and decreased 02 carrying capacity with daughters at the bedside, Paged MD to make aware.

## 2018-05-16 DIAGNOSIS — D61818 Other pancytopenia: Secondary | ICD-10-CM

## 2018-05-16 LAB — CBC WITH DIFFERENTIAL/PLATELET
Abs Immature Granulocytes: 0.01 10*3/uL (ref 0.00–0.07)
BASOS ABS: 0 10*3/uL (ref 0.0–0.1)
Basophils Relative: 0 %
Eosinophils Absolute: 0.1 10*3/uL (ref 0.0–0.5)
Eosinophils Relative: 3 %
HEMATOCRIT: 28.8 % — AB (ref 36.0–46.0)
HEMOGLOBIN: 8.9 g/dL — AB (ref 12.0–15.0)
IMMATURE GRANULOCYTES: 0 %
LYMPHS ABS: 1.2 10*3/uL (ref 0.7–4.0)
LYMPHS PCT: 33 %
MCH: 27.8 pg (ref 26.0–34.0)
MCHC: 30.9 g/dL (ref 30.0–36.0)
MCV: 90 fL (ref 80.0–100.0)
Monocytes Absolute: 0.4 10*3/uL (ref 0.1–1.0)
Monocytes Relative: 10 %
NEUTROS ABS: 2 10*3/uL (ref 1.7–7.7)
NEUTROS PCT: 54 %
NRBC: 0 % (ref 0.0–0.2)
Platelets: 147 10*3/uL — ABNORMAL LOW (ref 150–400)
RBC: 3.2 MIL/uL — ABNORMAL LOW (ref 3.87–5.11)
RDW: 15.2 % (ref 11.5–15.5)
WBC: 3.6 10*3/uL — ABNORMAL LOW (ref 4.0–10.5)

## 2018-05-16 MED ORDER — POLYSACCHARIDE IRON COMPLEX 150 MG PO CAPS
150.0000 mg | ORAL_CAPSULE | Freq: Every day | ORAL | Status: DC
  Administered 2018-05-16 – 2018-05-17 (×2): 150 mg via ORAL
  Filled 2018-05-16 (×2): qty 1

## 2018-05-16 MED ORDER — PANTOPRAZOLE SODIUM 40 MG PO TBEC
40.0000 mg | DELAYED_RELEASE_TABLET | Freq: Every day | ORAL | Status: DC
  Administered 2018-05-16 – 2018-05-17 (×2): 40 mg via ORAL
  Filled 2018-05-16 (×2): qty 1

## 2018-05-16 NOTE — Care Management Important Message (Signed)
Important Message  Patient Details  Name: Rebekah Macdonald MRN: [id number] Date of Birth: January 05, 1936   Medicare Important Message Given:  Yes    Oriana Horiuchi 05/16/2018, 2:55 PM

## 2018-05-16 NOTE — Evaluation (Signed)
Physical Therapy Evaluation Patient Details Name: Rebekah Macdonald MRN: [id number] DOB: [birth year] Today's Date: 05/16/2018   History of Present Illness  Pt is an 82 y.o. female admitted to the hospital for GI bleed. Pt is 1 day s/p EGD with clips placed. CT significant for small pulmonary nodule, no anticoagulation or IVC filter due to risk for bleeding and small size. PMH: HTN, peptic ulcer disease, GERD, COPD, CKD, pancytopenia  Clinical Impression  Pt presents to physical therapy with dizziness, fatigue and decreased aerobic tolerance secondary to GI bleed, EGD and comorbidities. This is limiting her ability to walk longer distances necessary to be active in her community and safely amb at home without help. Pt lives alone but has family members available for support. Pt edu on importance of walking frequently throughout the day to regain aerobic tolerance and importance of asking nursing for help to monitor SpO2 and telemetry. During session pt amb 200 ft, 1/4 dyspnea scale. No c/o dizziness. SpO2 95 on RA in sitting at end of session. Pt will benefit from skilled PT to increase their independence and safety with mobility to allow discharge to the venue listed below.       Follow Up Recommendations No PT follow up    Equipment Recommendations  None recommended by PT    Recommendations for Other Services       Precautions / Restrictions Precautions Precautions: Fall Restrictions Weight Bearing Restrictions: No      Mobility  Bed Mobility               General bed mobility comments: Pt presented sitting EOB with daughter present  Transfers Overall transfer level: Needs assistance Equipment used: None Transfers: Sit to/from Stand Sit to Stand: Min guard         General transfer comment: decreased speed. initially min guard for assessment purposes  Ambulation/Gait Ambulation/Gait assistance: Min guard Gait Distance (Feet): 200 Feet   Gait Pattern/deviations:  Step-through pattern;Decreased stride length Gait velocity: baseline Gait velocity interpretation: 1.31 - 2.62 ft/sec, indicative of limited community ambulator General Gait Details: difficulty holding conversation and looking to the left at me with amb, would stop to provide detailed answers. at end of session patient fatigued. 1/4 dyspnea scale. Pre session HR 73 bpm, during 94 bpm, after 84 bpm. Post session SpO2 95.  Stairs            Wheelchair Mobility    Modified Rankin (Stroke Patients Only)       Balance Overall balance assessment: Needs assistance Sitting-balance support: No upper extremity supported Sitting balance-Leahy Scale: Good Sitting balance - Comments: Found sitting EOB, did not require UE support to maintain upright position - lifted arms up to don gait belt.    Standing balance support: No upper extremity supported Standing balance-Leahy Scale: Good Standing balance comment: can navigate tight turns in room, 180* turn at end of hall no LOB                             Pertinent Vitals/Pain Pain Assessment: No/denies pain    Home Living Family/patient expects to be discharged to:: Private residence Living Arrangements: Alone Available Help at Discharge: Family Type of Home: House(small townhouse) Home Access: Level entry     Home Layout: One level   Additional Comments: Pt and daughter state she has no trouble getting in/out of tub shower. No shower seats because she doesn't want to sit down and not be  able to get up.    Prior Function Level of Independence: Needs assistance   Gait / Transfers Assistance Needed: No AD, amb in community and around house independetly  ADL's / Homemaking Assistance Needed: Family would occasionally come help with things around the house.   Comments: Walked 3-4 miles a day up until a year ago, volunteered in hospital & did water aerobics up until 6 weeks ago. Attends church.     Hand Dominance         Extremity/Trunk Assessment   Upper Extremity Assessment Upper Extremity Assessment: Overall WFL for tasks assessed    Lower Extremity Assessment Lower Extremity Assessment: Overall WFL for tasks assessed       Communication   Communication: No difficulties  Cognition Arousal/Alertness: Awake/alert Behavior During Therapy: WFL for tasks assessed/performed Overall Cognitive Status: Within Functional Limits for tasks assessed                                 General Comments: Oriented to self, family, DOB      General Comments General comments (skin integrity, edema, etc.): Pt's daughter was present throughout session.    Exercises     Assessment/Plan    PT Assessment Patient needs continued PT services  PT Problem List Decreased strength;Decreased mobility;Decreased activity tolerance;Cardiopulmonary status limiting activity;Decreased balance       PT Treatment Interventions Therapeutic activities;DME instruction;Gait training;Therapeutic exercise;Stair training;Balance training;Patient/family education;Functional mobility training;Neuromuscular re-education    PT Goals (Current goals can be found in the Care Plan section)  Acute Rehab PT Goals Patient Stated Goal: walk more so she can attend church trip PT Goal Formulation: With patient Time For Goal Achievement: 05/30/18 Potential to Achieve Goals: Good    Frequency Min 3X/week   Barriers to discharge        Co-evaluation               AM-PAC PT "6 Clicks" Daily Activity  Outcome Measure Difficulty turning over in bed (including adjusting bedclothes, sheets and blankets)?: None Difficulty moving from lying on back to sitting on the side of the bed? : None Difficulty sitting down on and standing up from a chair with arms (e.g., wheelchair, bedside commode, etc,.)?: None Help needed moving to and from a bed to chair (including a wheelchair)?: None Help needed walking in hospital room?:  None Help needed climbing 3-5 steps with a railing? : A Little 6 Click Score: 23    End of Session Equipment Utilized During Treatment: Gait belt Activity Tolerance: Patient tolerated treatment well;Treatment limited secondary to medical complications (Comment)(although no apparent dyspnea or complaints, pt held chest and said she was ready to sit when we approached room) Patient left: in chair;with family/visitor present;with call bell/phone within reach Nurse Communication: Mobility status PT Visit Diagnosis: Other abnormalities of gait and mobility (R26.89)    Time: 1610-9604 PT Time Calculation (min) (ACUTE ONLY): 16 min   Charges:   PT Evaluation $PT Eval Low Complexity: 1 Low          Pattricia Boss, SPT Acute Rehab Services [phone]   Pattricia Boss 05/16/2018, 9:50 AM

## 2018-05-16 NOTE — Plan of Care (Signed)

## 2018-05-16 NOTE — Care Management Note (Signed)
Case Management Note  Patient Details  Name: Rebekah Macdonald MRN: [id number] Date of Birth: 05-15-1936  Subjective/Objective:  GIB               Action/Plan: Patient lives alone, supportive daughter; PCP: Bradley FerrisDoyle, Patricia K, PA-C; has private insurance with [facility] with prescription drug coverage; pharmacy of choice is [facility] in [place]; no DME; pt/ daughter are requesting HHC services at discharge; choice offered, daughter requested [facility]; Dan with [facility] called for arrangements.  Expected Discharge Date:  05/13/18               Expected Discharge Plan:  Home w Home Health Services  Discharge planning Services  CM Consult  Choice offered to:  Patient, Adult Children  [facility]  [facility]  Status of Service:  In process, will continue to follow  Reola MosherChandler, Hernandez Losasso L, RN,MHA,BSN [phone] 05/16/2018, 2:21 PM

## 2018-05-16 NOTE — Progress Notes (Signed)
Subjective: Stools less dark. Still with some weakness and shortness-of-breath.  Objective: Vital signs in last 24 hours: Temp:  [97.3 F (36.3 C)-98.6 F (37 C)] 98.2 F (36.8 C) (11/21 1200) Pulse Rate:  [62-73] 66 (11/21 1200) Resp:  [14-18] 18 (11/21 1200) BP: (123-148)/(57-73) 123/57 (11/21 1200) SpO2:  [94 %-100 %] 98 % (11/21 1200) Weight:  [87.6 kg] 87.6 kg (11/21 0619) Weight change: -1.678 kg Last BM Date: 05/16/18  PE: GEN:  NAD  Lab Results: CBC    Component Value Date/Time   WBC 3.6 (L) 05/16/2018 0522   RBC 3.20 (L) 05/16/2018 0522   HGB 8.9 (L) 05/16/2018 0522   HCT 28.8 (L) 05/16/2018 0522   PLT 147 (L) 05/16/2018 0522   MCV 90.0 05/16/2018 0522   MCH 27.8 05/16/2018 0522   MCHC 30.9 05/16/2018 0522   RDW 15.2 05/16/2018 0522   LYMPHSABS 1.2 05/16/2018 0522   MONOABS 0.4 05/16/2018 0522   EOSABS 0.1 05/16/2018 0522   BASOSABS 0.0 05/16/2018 0522    Assessment:  1. GI bleed, duodenal avm bleed, endoscopy with clips done. 2. Acute blood loss anemia.  Hgb stable past 24 hours, no blood in stool. 3. Recent small pulmonary embolism.  Plan:  1.  PPI po qd indefinitely. 2.  Minimize NSAIDs, anticoagulants, as clinically feasible. 3.  Start oral iron, patient aware stools will turn black, continue now and upon discharge until further notice. 4.  OK to discharge home later today or tomorrow (patient/daughter preference, very reasonably so, to see whether or not she'll require oxygen at home) from GI perspective. 5.  We will arrange repeat CBC in 1-2 weeks and outpatient follow-up with me in a few weeks. 6.  [facility] will sign-off; please call with questions; thank you for the consultation.   Freddy JakschOUTLAW,Kaimana Neuzil M 05/16/2018, 12:57 PM   Cell [phone] If no answer or after 5 PM call [phone]

## 2018-05-16 NOTE — Progress Notes (Signed)
PROGRESS NOTE    Rebekah Macdonald  GEX:[id number] DOB: 11-Oct-1935 DOA: 05/11/2018 PCP: [name], PA-C    Brief Narrative:  82 year old female who presented with dark stools, generalized weakness, epigastric abdominal pain and dyspnea.  She does have significant past medical history for peptic ulcer disease, hypertension, dyslipidemia, COPD, GERD, pancytopenia, chronic kidney disease stage III pulmonary embolism.  Large amounts of dark stool associated with epigastric abdominal pain, no nausea or vomiting.  Physical examination blood pressure 121/57, heart rate 79, respirate 12, temperature 98.9, oxygen saturation 98%.  Heart S1-S2 present and rhythmic, lungs clear to auscultation bilaterally, abdomen tender in the epigastric area, no lower extremity edema.  Patient was admitted to the hospital with the working diagnosis of acute blood loss anemia due to upper GI bleed.   Assessment & Plan:   Principal Problem:   GIB (gastrointestinal bleeding) Active Problems:   Pulmonary embolism (HCC)   COPD, mild (HCC)   HLD (hyperlipidemia)   Essential hypertension   Gout   Pancytopenia (HCC)   Symptomatic anemia   Hypokalemia   CKD (chronic kidney disease), stage III (HCC)   Pulmonary nodule   Acute upper GI bleeding  1. Acute upper GI bleed due to duodenal avm bleed (sp clips) complicated with acute blood loss anemia. Sp total of 3 units PRBC transfusion.  No further signs of active bleeding, hb stable at 8,9, persistent black stools but less intensity, continue to have dyspepsia that has improved with antiacids. Continue bid IV pantoprazole and qid sucralfate. Tolerating po well.   2.  Subsegmental pulmonary embolism. No indication for treatment, per Chest guideline recommendations. Dyspnea possible multifactorial, continue physical therapy evaluation and out of bed as tolerated. [place] lower extremities, negative for DVT.   3. COPD with pulmonary HTN. No clinical signs of  exacerbation. As needed bronchodilator therapy.   4. HTN. Blood pressure control with amlodipine. Systolic 126 mmHg.   DVT prophylaxis: scd   Code Status:  dnr  Family Communication: I spoke with patient's daughter at the bedside and all questions were addressed.  Disposition Plan/ discharge barriers: pending clinical improvement, and physical therapy evaluation.   Body mass index is 33.16 kg/m. Malnutrition Type:      Malnutrition Characteristics:      Nutrition Interventions:     RN Pressure Injury Documentation:     Consultants:   GI   Procedures:   EGD  Antimicrobials:       Subjective: Patient continue to have dyspepsia, improved with antiacids but not completely resolved, has dyspnea and intermittent black stools. No nausea or vomiting.   Objective: Vitals:   05/16/18 0619 05/16/18 0740 05/16/18 0800 05/16/18 0914  BP: (!) 148/73  133/66   Pulse: 70  66 73  Resp: 15  16   Temp: 98.6 F (37 C)  (!) 97.3 F (36.3 C)   TempSrc: Oral  Oral   SpO2: 97% 94% 100% 95%  Weight: 87.6 kg     Height:        Intake/Output Summary (Last 24 hours) at 05/16/2018 1207 Last data filed at 05/16/2018 0900 Gross per 24 hour  Intake 1080 ml  Output 2450 ml  Net -1370 ml   Filed Weights   05/14/18 0418 05/15/18 0056 05/16/18 0619  Weight: 88.8 kg 89.3 kg 87.6 kg    Examination:   General: deconditioning  Neurology: Awake and alert, non focal  E ENT: positive pallor, no icterus, oral mucosa moist Cardiovascular: No JVD. S1-S2 present, rhythmic, no  gallops, rubs, or murmurs. No lower extremity edema. Pulmonary: positive breath sounds bilaterally, adequate air movement, no wheezing, rhonchi or rales. Gastrointestinal. Abdomen mild distended with no organomegaly, non tender, no rebound or guarding Skin. No rashes Musculoskeletal: no joint deformities     Data Reviewed: I have personally reviewed following labs and imaging studies  CBC: Recent  Labs  Lab 05/11/18 1744  05/13/18 1853 05/14/18 0412 05/14/18 2025 05/15/18 0400 05/16/18 0522  WBC 5.3   < > 6.1 3.7* 4.0 4.1 3.6*  NEUTROABS 3.2  --   --   --   --   --  2.0  HGB 6.9*   < > 8.0* 7.0* 9.3* 8.6* 8.9*  HCT 22.4*   < > 24.8* 22.4* 29.0* 26.6* 28.8*  MCV 92.9   < > 87.9 88.9 90.6 89.3 90.0  PLT 123*   < > 120* 109* 121* 120* 147*   < > = values in this interval not displayed.   Basic Metabolic Panel: Recent Labs  Lab 05/11/18 1744 05/12/18 0540 05/15/18 0400  NA 132* 136 135  K 3.4* 4.0 3.8  CL 103 106 105  CO2 21* 23 23  GLUCOSE 157* 95 93  BUN 33* 29* 9  CREATININE 1.01* 1.01* 1.12*  CALCIUM 9.4 9.3 9.0   GFR: Estimated Creatinine Clearance: 42.9 mL/min (A) (by C-G formula based on SCr of 1.12 mg/dL (H)). Liver Function Tests: No results for input(s): AST, ALT, ALKPHOS, BILITOT, PROT, ALBUMIN in the last 168 hours. No results for input(s): LIPASE, AMYLASE in the last 168 hours. No results for input(s): AMMONIA in the last 168 hours. Coagulation Profile: Recent Labs  Lab 05/12/18 0540  INR 1.11   Cardiac Enzymes: Recent Labs  Lab 05/13/18 0817 05/13/18 1425 05/13/18 1853  TROPONINI <0.03 <0.03 <0.03   BNP (last 3 results) No results for input(s): PROBNP in the last 8760 hours. HbA1C: No results for input(s): HGBA1C in the last 72 hours. CBG: No results for input(s): GLUCAP in the last 168 hours. Lipid Profile: No results for input(s): CHOL, HDL, LDLCALC, TRIG, CHOLHDL, LDLDIRECT in the last 72 hours. Thyroid Function Tests: No results for input(s): TSH, T4TOTAL, FREET4, T3FREE, THYROIDAB in the last 72 hours. Anemia Panel: No results for input(s): VITAMINB12, FOLATE, FERRITIN, TIBC, IRON, RETICCTPCT in the last 72 hours.    Radiology Studies: I have reviewed all of the imaging during this hospital visit personally     Scheduled Meds: . sodium chloride   Intravenous Once  . allopurinol  100 mg Oral Daily  . amLODipine  5 mg Oral  Daily  . cholecalciferol  5,000 Units Oral Daily  . latanoprost  1 drop Both Eyes QHS  . multivitamin with minerals  1 tablet Oral Daily  . pantoprazole  40 mg Intravenous Q12H  . rosuvastatin  10 mg Oral Q48H  . sucralfate  1 g Oral TID WC & HS  . umeclidinium-vilanterol  1 puff Inhalation Daily   Continuous Infusions:   LOS: 3 days        Vance Hochmuth Annett Gulaaniel Keeshawn Fakhouri, MD [facility] Pager [phone]

## 2018-05-17 LAB — HEMOGLOBIN AND HEMATOCRIT, BLOOD
HEMATOCRIT: 28.3 % — AB (ref 36.0–46.0)
Hemoglobin: 9 g/dL — ABNORMAL LOW (ref 12.0–15.0)

## 2018-05-17 MED ORDER — PANTOPRAZOLE SODIUM 40 MG PO TBEC: 40.0000 mg | DELAYED_RELEASE_TABLET | Freq: Every day | ORAL | 0 refills | Status: AC

## 2018-05-17 MED ORDER — SUCRALFATE 1 G PO TABS: 1.0000 g | ORAL_TABLET | Freq: Three times a day (TID) | ORAL | 0 refills | Status: AC

## 2018-05-17 NOTE — Progress Notes (Signed)
Physical Therapy Treatment Patient Details Name: Rebekah Macdonald MRN: [id number] DOB: 05-20-1936 Today's Date: 05/17/2018    History of Present Illness Pt is an 82 y.o. female admitted to the hospital for GI bleed. Pt is 1 day s/p EGD with clips placed. CT significant for small pulmonary nodule, no anticoagulation or IVC filter due to risk for bleeding and small size. PMH: HTN, peptic ulcer disease, GERD, COPD, CKD, pancytopenia    PT Comments    Pt progressing well with mobility. Ambulating 300 feet without AD supervision. Plan is for d/c home today. Pt lives alone but daughter is able to provide assist as needed.   Follow Up Recommendations  No PT follow up     Equipment Recommendations  None recommended by PT    Recommendations for Other Services       Precautions / Restrictions Precautions Precautions: Fall Restrictions Weight Bearing Restrictions: No    Mobility  Bed Mobility Overal bed mobility: Modified Independent                Transfers Overall transfer level: Needs assistance Equipment used: None Transfers: Sit to/from Stand Sit to Stand: Supervision         General transfer comment: increased time to stabilize initial standing balance  Ambulation/Gait Ambulation/Gait assistance: Supervision Gait Distance (Feet): 300 Feet Assistive device: None Gait Pattern/deviations: Decreased stride length;Step-through pattern Gait velocity: WFL Gait velocity interpretation: 1.31 - 2.62 ft/sec, indicative of limited community ambulator General Gait Details: SpO2 91% after ambulation   Stairs             Wheelchair Mobility    Modified Rankin (Stroke Patients Only)       Balance   Sitting-balance support: No upper extremity supported;Feet supported Sitting balance-Leahy Scale: Good     Standing balance support: No upper extremity supported;During functional activity Standing balance-Leahy Scale: Good Standing balance comment: no LOB  during amb without AD                            Cognition Arousal/Alertness: Awake/alert Behavior During Therapy: WFL for tasks assessed/performed Overall Cognitive Status: Within Functional Limits for tasks assessed                                        Exercises      General Comments General comments (skin integrity, edema, etc.): Pt's daughter present during session.      Pertinent Vitals/Pain Pain Assessment: No/denies pain    Home Living                      Prior Function            PT Goals (current goals can now be found in the care plan section) Acute Rehab PT Goals Patient Stated Goal: walk more so she can attend church trip PT Goal Formulation: With patient Time For Goal Achievement: 05/30/18 Potential to Achieve Goals: Good Progress towards PT goals: Progressing toward goals    Frequency    Min 3X/week      PT Plan Current plan remains appropriate    Co-evaluation              AM-PAC PT "6 Clicks" Daily Activity  Outcome Measure  Difficulty turning over in bed (including adjusting bedclothes, sheets and blankets)?: None Difficulty moving from lying on back to  sitting on the side of the bed? : None Difficulty sitting down on and standing up from a chair with arms (e.g., wheelchair, bedside commode, etc,.)?: None Help needed moving to and from a bed to chair (including a wheelchair)?: None Help needed walking in hospital room?: None Help needed climbing 3-5 steps with a railing? : A Little 6 Click Score: 23    End of Session   Activity Tolerance: Patient tolerated treatment well Patient left: in bed;with call bell/phone within reach;with family/visitor present Nurse Communication: Mobility status PT Visit Diagnosis: Other abnormalities of gait and mobility (R26.89)     Time: [id number] PT Time Calculation (min) (ACUTE ONLY): 10 min  Charges:  $Gait Training: 8-22 mins                     Aida RaiderWendy  Rejina Odle, PT  Office # [phone] Pager [phone]    Ilda FoilGarrow, Ajene Carchi Rene 05/17/2018, 11:20 AM

## 2018-05-17 NOTE — Discharge Summary (Signed)
Physician Discharge Summary  Rebekah Macdonald WUJ:[id number] DOB: 1935/12/25 DOA: 05/11/2018  PCP: [name], PA-C  Admit date: 05/11/2018 Discharge date: 05/17/2018  Admitted From: Home  Disposition:  Home   Recommendations for Outpatient Follow-up and new medication changes:  1. Follow up with Cruz Condon in 7 days.  2. Patient has been placed on pantoprazole bid  3. Continue sucralfate for the next 14 days.  4. 4 mm right lower lobe pulmonary nodule/no follow-up needed if patient is low risk/ if high risk will need repeat CT chest in 12 months.  Home Health: Yes   Equipment/Devices: no    Discharge Condition: stable  CODE STATUS: full  Diet recommendation: heart healthy   Brief/Interim Summary: 82 year old female who presented with dark stools, generalized weakness, epigastric abdominal pain and dyspnea. She does have the significant past medical history for peptic ulcer disease, hypertension, dyslipidemia, COPD, GERD, pancytopenia, chronic kidney disease stage III, and suspected pulmonary embolism. Reported large amounts of dark stool associated with epigastric abdominal pain, no nausea or vomiting. On her initial physical examination blood pressure 121/57, heart rate 79, respiratory rate 12, temperature 98.9, oxygen saturation 98%. Heart S1-S2 present and rhythmic, lungs clear to auscultation bilaterally, abdomen tender in the epigastric area,no lower extremity edema.  Sodium 132, potassium 3.4, chloride 103, bicarb 21, glucose 157, BUN 33, creatinine 1.0, white count 5.3, hemoglobin 6.9, hematocrit 22.4, platelets 123, fecal occult blood was positive, CT of the abdomen with no acute findings, 4 mm right lower lobe pulmonary nodule.  Chest radiograph with no infiltrates.  EKG sinus rhythm, normal axis, positive PVC.  Patient was admitted to the hospital withtheworking diagnosis of acute blood loss anemia due to upper GI bleed.  1.  Acute upper GI bleed due to  duodenal AVM, complicated by acute blood loss anemia.  Patient was admitted to the medical ward, she was placed on a remote telemetry monitor, required a total of 3 units of PRBC transfusions with good toleration. Medical therapy with antiacid therapy with IV pantoprazole BID, intravenous fluids, as needed analgesics and antiemetics. She underwent upper endoscopy, it shows small hiatal hernia, nonbleeding erosive gastropathy, few recently bleeding angiectasias in the distal duodenum, clips were placed.  Nonbleeding duodenal diverticulum.  Her diet was advanced with good toleration, no further signs of bleeding, her discharge hemoglobin is 9.0, hematocrit 28.3.  2.  Iron deficiency anemia.  Iron 48, TIBC 315, transferrin saturation 15, ferritin 32, patient received IV iron in the hospital.  3.  Subsegmental pulmonary embolism.  Patient has been asymptomatic, possible artifact on imaging (discussed with radiology).  Considering her upper GI bleed, will follow chest guidelines and will hold further anticoagulation for now.  3.  COPD with pulmonary hypertension.  No current exacerbation, continue bronchodilator therapy.  4.  Hypertension.  Patient received amlodipine during hospitalization, at her discharged home she will resume valsartan/ hctz.   Discharge Diagnoses:  Principal Problem:   GIB (gastrointestinal bleeding) Active Problems:   Pulmonary embolism (HCC)   COPD, mild (HCC)   HLD (hyperlipidemia)   Essential hypertension   Gout   Pancytopenia (HCC)   Symptomatic anemia   Hypokalemia   CKD (chronic kidney disease), stage III (HCC)   Pulmonary nodule   Acute upper GI bleeding    Discharge Instructions   Allergies as of 05/17/2018      Reactions   Ace Inhibitors Cough   Penicillins Other (See Comments)   Possibly swelling and rash at about 82 years old Has  patient had a PCN reaction causing immediate rash, facial/tongue/throat swelling, SOB or lightheadedness with hypotension:  Unknown Has patient had a PCN reaction causing severe rash involving mucus membranes or skin necrosis: Unknown Has patient had a PCN reaction that required hospitalization: No Has patient had a PCN reaction occurring within the last 10 years: No If all of the above answers are "NO", then may proceed with Cephalosporin use.      Medication List    STOP taking these medications   ELIQUIS STARTER PACK 5 MG Tabs     TAKE these medications   acetaminophen 650 MG CR tablet Commonly known as:  TYLENOL Take 1,300 mg by mouth daily.   albuterol 108 (90 Base) MCG/ACT inhaler Commonly known as:  PROVENTIL HFA;VENTOLIN HFA Inhale 1-2 puffs into the lungs every 6 (six) hours as needed for wheezing or shortness of breath.   allopurinol 100 MG tablet Commonly known as:  ZYLOPRIM Take 100 mg by mouth daily.   calcium carbonate 750 MG chewable tablet Commonly known as:  TUMS EX Chew 2 tablets by mouth 4 (four) times daily as needed for heartburn. What changed:  Another medication with the same name was removed. Continue taking this medication, and follow the directions you see here.   Fish Oil 1200 MG Caps Take 1,200 mg by mouth 2 (two) times daily.   fluticasone 50 MCG/ACT nasal spray Commonly known as:  FLONASE Place 1 spray into both nostrils daily as needed for allergies or rhinitis.   GAS-X EXTRA STRENGTH 125 MG chewable tablet Generic drug:  simethicone Chew 250 mg by mouth every 6 (six) hours as needed for flatulence. What changed:  Another medication with the same name was removed. Continue taking this medication, and follow the directions you see here.   HAIR VITAMINS PO Take 2 tablets by mouth daily. Hairfinity   latanoprost 0.005 % ophthalmic solution Commonly known as:  XALATAN Place 1 drop into both eyes at bedtime.   multivitamin with minerals Tabs tablet Take 1 tablet by mouth daily.   pantoprazole 40 MG tablet Commonly known as:  PROTONIX Take 1 tablet (40 mg  total) by mouth daily.   rosuvastatin 10 MG tablet Commonly known as:  CRESTOR Take 10 mg by mouth See admin instructions. Take one tablet (10 mg) by mouth on even numbered nights   sucralfate 1 g tablet Commonly known as:  CARAFATE Take 1 tablet (1 g total) by mouth 4 (four) times daily -  with meals and at bedtime for 14 days.   umeclidinium-vilanterol 62.5-25 MCG/INH Aepb Commonly known as:  ANORO ELLIPTA Inhale 1 puff into the lungs daily.   valsartan-hydrochlorothiazide 160-12.5 MG tablet Commonly known as:  DIOVAN-HCT Take 1 tablet by mouth daily.   Vitamin D-3 125 MCG (5000 UT) Tabs Take 5,000 Units by mouth daily.      Follow-up Information    [facility] - Dme Follow up.   Why:  They will do your home health care at your home Contact information: [address] [place] [address] [phone]        [name], PA-C.   Specialty:  Physician Assistant Why:  Please call for appointment Contact information: [address] [place] [address] [phone]          Allergies  Allergen Reactions  . Ace Inhibitors Cough  . Penicillins Other (See Comments)    Possibly swelling and rash at about 82 years old Has patient had a  PCN reaction causing immediate rash, facial/tongue/throat swelling, SOB or lightheadedness with hypotension: Unknown Has patient had a PCN reaction causing severe rash involving mucus membranes or skin necrosis: Unknown Has patient had a PCN reaction that required hospitalization: No Has patient had a PCN reaction occurring within the last 10 years: No If all of the above answers are "NO", then may proceed with Cephalosporin use.    Consultations: GI   Procedures/Studies: Ct Abdomen Pelvis Wo Contrast  Result Date: 05/11/2018 CLINICAL DATA:  Abdominal pain. EXAM: CT ABDOMEN AND PELVIS WITHOUT CONTRAST TECHNIQUE: Multidetector CT imaging of the abdomen and pelvis was performed following the standard  protocol without IV contrast. COMPARISON:  None. FINDINGS: Lower chest: Coronary artery calcification is evident. Atherosclerotic calcification is noted in the wall of the thoracic aorta. 4 mm posterior right lower lobe pulmonary nodule evident. Hepatobiliary: No focal abnormality in the liver on this study without intravenous contrast. There is no evidence for gallstones, gallbladder wall thickening, or pericholecystic fluid. No intrahepatic or extrahepatic biliary dilation. Pancreas: No focal mass lesion. No dilatation of the main duct. No intraparenchymal cyst. No peripancreatic edema. Spleen: No splenomegaly. No focal mass lesion. Adrenals/Urinary Tract: No adrenal nodule or mass. Kidneys unremarkable. No evidence for hydroureter. The urinary bladder appears normal for the degree of distention. Stomach/Bowel: Stomach is nondistended. No gastric wall thickening. No evidence of outlet obstruction. Duodenum is normally positioned as is the ligament of Treitz. No small bowel wall thickening. No small bowel dilatation. The terminal ileum is normal. The appendix is normal. No gross colonic mass. No colonic wall thickening. Vascular/Lymphatic: There is abdominal aortic atherosclerosis without aneurysm. There is no gastrohepatic or hepatoduodenal ligament lymphadenopathy. No intraperitoneal or retroperitoneal lymphadenopathy. No pelvic sidewall lymphadenopathy. Reproductive: Uterus surgically absent.  There is no adnexal mass. Other: No intraperitoneal free fluid. Musculoskeletal: Small bilateral groin hernias contain only fat the No worrisome lytic or sclerotic osseous abnormality. IMPRESSION: 1. No acute findings in the abdomen or pelvis. 2. 4 mm right lower lobe pulmonary nodule. No follow-up needed if patient is low-risk. Non-contrast chest CT can be considered in 12 months if patient is high-risk. This recommendation follows the consensus statement: Guidelines for Management of Incidental Pulmonary Nodules Detected  on CT Images: From the Fleischner Society 2017; Radiology 2017; 284:228-243. 3.  Aortic Atherosclerois (ICD10-170.0) Electronically Signed   By: Kennith Center M.D.   On: 05/11/2018 20:52   Dg Chest 2 View  Result Date: 05/11/2018 CLINICAL DATA:  Chest pain and shortness of breath. EXAM: CHEST - 2 VIEW COMPARISON:  05/01/2018 FINDINGS: The lungs are clear without focal pneumonia, edema, pneumothorax or pleural effusion. The cardiopericardial silhouette is within normal limits for size. The visualized bony structures of the thorax are intact. Telemetry leads overlie the chest. IMPRESSION: No active cardiopulmonary disease. Electronically Signed   By: Kennith Center M.D.   On: 05/11/2018 18:46   Dg Chest 2 View  Result Date: 05/01/2018 CLINICAL DATA:  Shortness of breath EXAM: CHEST - 2 VIEW COMPARISON:  04/21/2016 FINDINGS: The heart size and mediastinal contours are within normal limits. Aortic atherosclerosis. Both lungs are clear. IMPRESSION: No active cardiopulmonary disease. Electronically Signed   By: Jasmine Pang M.D.   On: 05/01/2018 21:52   Ct Angio Chest Pe W And/or Wo Contrast  Result Date: 05/01/2018 CLINICAL DATA:  Chest pain with shortness of breath for 6 weeks. EXAM: CT ANGIOGRAPHY CHEST WITH CONTRAST TECHNIQUE: Multidetector CT imaging of the chest was performed using the standard protocol during bolus  administration of intravenous contrast. Multiplanar CT image reconstructions and MIPs were obtained to evaluate the vascular anatomy. CONTRAST:  ISOVUE-370 IOPAMIDOL (ISOVUE-370) INJECTION 76% COMPARISON:  High-resolution chest CT 04/03/2018 FINDINGS: Cardiovascular: Pulmonary arterial opacification is adequate. A very small, thin linear filling defect is questioned in a right lower lobe segmental artery (series 9, image 186). An additional small linear filling defect is questioned in a right lower lobe subsegmental branch with assessment mildly limited by an adjacent vein (series 9,  image 202). There are no lobar or more central emboli. Thoracic aortic atherosclerosis is noted without aneurysm. There is an aberrant right subclavian artery. The pulmonary arteries remain enlarged with the main pulmonary artery measuring 3.4 cm in diameter. Coronary artery atherosclerosis is noted. The heart is normal in size. There is no pericardial effusion. Mediastinum/Nodes: The thyroid is again noted to be heterogeneous with mild asymmetric enlargement of the left lobe as well as a nodule extending inferiorly from the isthmus, partially obscured by streak artifact today and evaluated by ultrasound on 04/18/2018. No enlarged axillary, mediastinal, or hilar lymph nodes are identified. The esophagus is unremarkable. Lungs/Pleura: No pleural effusion or pneumothorax. Mild centrilobular and paraseptal emphysema. Mild respiratory motion artifact with mild dependent atelectasis bilaterally. No mass. Upper Abdomen: No acute abnormality. Musculoskeletal: No acute osseous abnormality or suspicious osseous lesion. Mild thoracic disc and facet degeneration. Review of the MIP images confirms the above findings. IMPRESSION: 1. Two possible tiny right lower lobe segmental/subsegmental emboli. No occlusive or more proximal embolus. 2. Unchanged pulmonary arterial enlargement compatible with pulmonary arterial hypertension. 3. Aortic Atherosclerosis (ICD10-I70.0) and Emphysema (ICD10-J43.9). Electronically Signed   By: Sebastian Ache M.D.   On: 05/01/2018 23:29   Vas [place] Lower Extremity Venous (dvt)  Result Date: 05/13/2018  Lower Venous Study Other             Patient with recent PE found last week, now back with a GI Indications:      bleed. Performing Technologist: Sherren Kerns RVS  Examination Guidelines: A complete evaluation includes B-mode imaging, spectral Doppler, color Doppler, and power Doppler as needed of all accessible portions of each vessel. Bilateral testing is considered an integral part of a complete  examination. Limited examinations for reoccurring indications may be performed as noted.  Right Venous Findings: +---------+---------------+---------+-----------+----------+-------+          CompressibilityPhasicitySpontaneityPropertiesSummary +---------+---------------+---------+-----------+----------+-------+ CFV      Full           Yes      Yes                          +---------+---------------+---------+-----------+----------+-------+ SFJ      Full                                                 +---------+---------------+---------+-----------+----------+-------+ FV Prox  Full                                                 +---------+---------------+---------+-----------+----------+-------+ FV Mid   Full                                                 +---------+---------------+---------+-----------+----------+-------+  FV DistalFull                                                 +---------+---------------+---------+-----------+----------+-------+ PFV      Full                                                 +---------+---------------+---------+-----------+----------+-------+ POP      Full           Yes      Yes                          +---------+---------------+---------+-----------+----------+-------+ PTV      Full                                                 +---------+---------------+---------+-----------+----------+-------+ PERO     Full                                                 +---------+---------------+---------+-----------+----------+-------+  Left Venous Findings: +---------+---------------+---------+-----------+----------+-------+          CompressibilityPhasicitySpontaneityPropertiesSummary +---------+---------------+---------+-----------+----------+-------+ CFV      Full           Yes      Yes                          +---------+---------------+---------+-----------+----------+-------+ SFJ      Full                                                  +---------+---------------+---------+-----------+----------+-------+ FV Prox  Full                                                 +---------+---------------+---------+-----------+----------+-------+ FV Mid   Full                                                 +---------+---------------+---------+-----------+----------+-------+ FV DistalFull                                                 +---------+---------------+---------+-----------+----------+-------+ PFV      Full                                                 +---------+---------------+---------+-----------+----------+-------+  POP      Full           Yes      Yes                          +---------+---------------+---------+-----------+----------+-------+ PTV      Full                                                 +---------+---------------+---------+-----------+----------+-------+ PERO     Full                                                 +---------+---------------+---------+-----------+----------+-------+    Summary: Right: There is no evidence of deep vein thrombosis in the lower extremity. Left: There is no evidence of deep vein thrombosis in the lower extremity.  *See table(s) above for measurements and observations. Electronically signed by Lemar Livings MD on 05/13/2018 at 4:23:24 PM.    Final        Subjective: Patient is feeling better, no nausea or vomiting, no dark stools, dyspnea has improved, no chest pain.   Discharge Exam: Vitals:   05/17/18 0501 05/17/18 0856  BP: (!) 146/67   Pulse: 76   Resp: 18   Temp: 97.6 F (36.4 C)   SpO2: 96% 97%   Vitals:   05/16/18 2001 05/17/18 0338 05/17/18 0501 05/17/18 0856  BP: (!) 119/51  (!) 146/67   Pulse: 77  76   Resp: 18  18   Temp: 98 F (36.7 C)  97.6 F (36.4 C)   TempSrc: Oral  Oral   SpO2: 97% 97% 96% 97%  Weight:   87.1 kg   Height:        General: Not in pain or  dyspnea  Neurology: Awake and alert, non focal  E ENT: mild pallor, no icterus, oral mucosa moist Cardiovascular: No JVD. S1-S2 present, rhythmic, no gallops, rubs, or murmurs. No lower extremity edema. Pulmonary: positive breath sounds bilaterally, adequate air movement, no wheezing, rhonchi or rales. Gastrointestinal. Abdomen with no organomegaly, non tender, no rebound or guarding Skin. No rashes Musculoskeletal: no joint deformities   The results of significant diagnostics from this hospitalization (including imaging, microbiology, ancillary and laboratory) are listed below for reference.     Microbiology: No results found for this or any previous visit (from the past 240 hour(s)).   Labs: BNP (last 3 results) Recent Labs    05/02/18 0116 05/11/18 1744  BNP 90.0 66.7   Basic Metabolic Panel: Recent Labs  Lab 05/11/18 1744 05/12/18 0540 05/15/18 0400  NA 132* 136 135  K 3.4* 4.0 3.8  CL 103 106 105  CO2 21* 23 23  GLUCOSE 157* 95 93  BUN 33* 29* 9  CREATININE 1.01* 1.01* 1.12*  CALCIUM 9.4 9.3 9.0   Liver Function Tests: No results for input(s): AST, ALT, ALKPHOS, BILITOT, PROT, ALBUMIN in the last 168 hours. No results for input(s): LIPASE, AMYLASE in the last 168 hours. No results for input(s): AMMONIA in the last 168 hours. CBC: Recent Labs  Lab 05/11/18 1744  05/13/18 1853 05/14/18 0412 05/14/18 2025 05/15/18 0400 05/16/18 0522 05/17/18 0448  WBC 5.3   < >  6.1 3.7* 4.0 4.1 3.6*  --   NEUTROABS 3.2  --   --   --   --   --  2.0  --   HGB 6.9*   < > 8.0* 7.0* 9.3* 8.6* 8.9* 9.0*  HCT 22.4*   < > 24.8* 22.4* 29.0* 26.6* 28.8* 28.3*  MCV 92.9   < > 87.9 88.9 90.6 89.3 90.0  --   PLT 123*   < > 120* 109* 121* 120* 147*  --    < > = values in this interval not displayed.   Cardiac Enzymes: Recent Labs  Lab 05/13/18 0817 05/13/18 1425 05/13/18 1853  TROPONINI <0.03 <0.03 <0.03   BNP: Invalid input(s): POCBNP CBG: No results for input(s): GLUCAP  in the last 168 hours. D-Dimer No results for input(s): DDIMER in the last 72 hours. Hgb A1c No results for input(s): HGBA1C in the last 72 hours. Lipid Profile No results for input(s): CHOL, HDL, LDLCALC, TRIG, CHOLHDL, LDLDIRECT in the last 72 hours. Thyroid function studies No results for input(s): TSH, T4TOTAL, T3FREE, THYROIDAB in the last 72 hours.  Invalid input(s): FREET3 Anemia work up No results for input(s): VITAMINB12, FOLATE, FERRITIN, TIBC, IRON, RETICCTPCT in the last 72 hours. Urinalysis No results found for: COLORURINE, APPEARANCEUR, LABSPEC, PHURINE, GLUCOSEU, HGBUR, BILIRUBINUR, KETONESUR, PROTEINUR, UROBILINOGEN, NITRITE, LEUKOCYTESUR Sepsis Labs Invalid input(s): PROCALCITONIN,  WBC,  LACTICIDVEN Microbiology No results found for this or any previous visit (from the past 240 hour(s)).   Time coordinating discharge: 45 minutes  SIGNED:   Coralie KeensMauricio Daniel Proctor Carriker, MD  [facility] 05/17/2018, 10:06 AM Pager [phone]  If 7PM-7AM, please contact night-coverage [email or web address] Password [email or web address]

## 2018-05-17 NOTE — Progress Notes (Signed)
Pt and daughter given all discharge instructions and verbalized understanding.  All questions answered for pt.  Pt discharged home with all belongings via wc to daughter's car.

## 2018-06-10 ENCOUNTER — Other Ambulatory Visit (HOSPITAL_COMMUNITY): Payer: Self-pay

## 2018-06-10 DIAGNOSIS — R1310 Dysphagia, unspecified: Secondary | ICD-10-CM

## 2018-06-14 ENCOUNTER — Ambulatory Visit (HOSPITAL_COMMUNITY)
Admission: RE | Admit: 2018-06-14 | Discharge: 2018-06-14 | Disposition: A | Discharge status: Home / Self Care | Payer: Medicare HMO | Source: Ambulatory Visit | Attending: Gastroenterology | Admitting: Gastroenterology

## 2018-06-14 DIAGNOSIS — R1310 Dysphagia, unspecified: Secondary | ICD-10-CM | POA: Diagnosis not present

## 2018-06-14 NOTE — Progress Notes (Signed)
Modified Barium Swallow Progress Note  Patient Details  Name: Rebekah Macdonald MRN: [id number] Date of Birth: 08/25/1935  Today's Date: 06/14/2018  Modified Barium Swallow completed.  Full report located under Chart Review in the Imaging Section.  Brief recommendations include the following:  Clinical Impression  Pt demonstrates normal oral and oropharyngeal function. She is noted to have appearance of stasis thought the esophgus asfter several trials of puree/solid. Barium tablet also hesitated in proximal esophagus but cleared with a sip of water. Recommend f/u per MD as pt may have a primary esophageal issue impacting swallow. No SLP f/u needed will sign off.    Swallow Evaluation Recommendations   Recommended Consults: Consider esophageal assessment;Consider GI evaluation   SLP Diet Recommendations: Regular solids;Thin liquid   Liquid Administration via: Cup;Straw   Medication Administration: Whole meds with liquid   Supervision: Patient able to self feed                  Rebekah DittyBonnie Eliyana Pagliaro, MA CCC-SLP  Acute [facility] Services Pager [phone] Office [phone]   Rebekah Macdonald, Rebekah Macdonald 06/14/2018,11:56 AM

## 2018-06-18 ENCOUNTER — Encounter (HOSPITAL_COMMUNITY): Payer: Medicare HMO

## 2018-06-18 ENCOUNTER — Ambulatory Visit (HOSPITAL_COMMUNITY): Payer: Medicare HMO

## 2018-07-02 ENCOUNTER — Ambulatory Visit: Payer: Medicare HMO

## 2019-01-20 IMAGING — RF DG SWALLOWING FUNCTION
12 of 16 series · 12 of 24 positions shown · non-contrast
Comparison: None.

CLINICAL DATA: Choking sensation

EXAM:
MODIFIED BARIUM SWALLOW
TECHNIQUE: Different consistencies of barium were administered orally to the
patient by the Speech Pathologist. Imaging of the pharynx was
performed in the lateral projection.
FLUOROSCOPY TIME:  Fluoroscopy Time:  1 minutes 15 seconds
Number of Acquired Spot Images: 16

[Series 1: run · 1 of 18 frames shown (1 of 12)]
[frame 16/18]
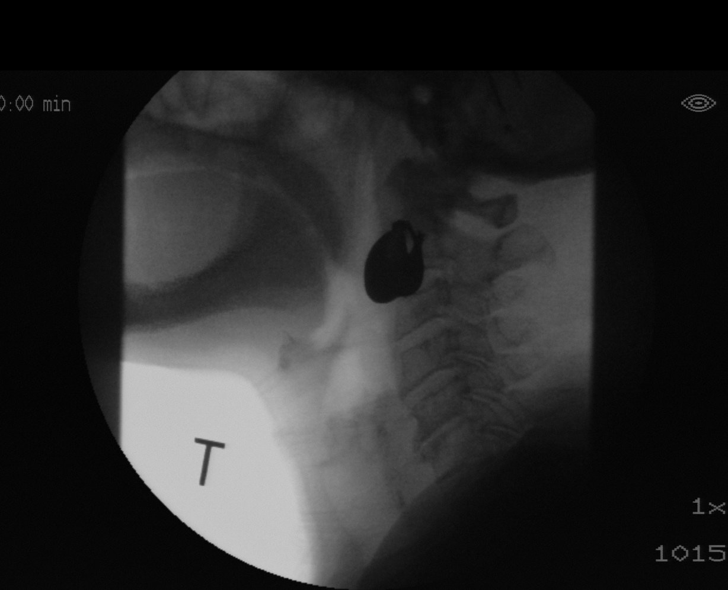

[Series 3: run · 1 of 22 frames shown (2 of 12)]
[frame 4/22]
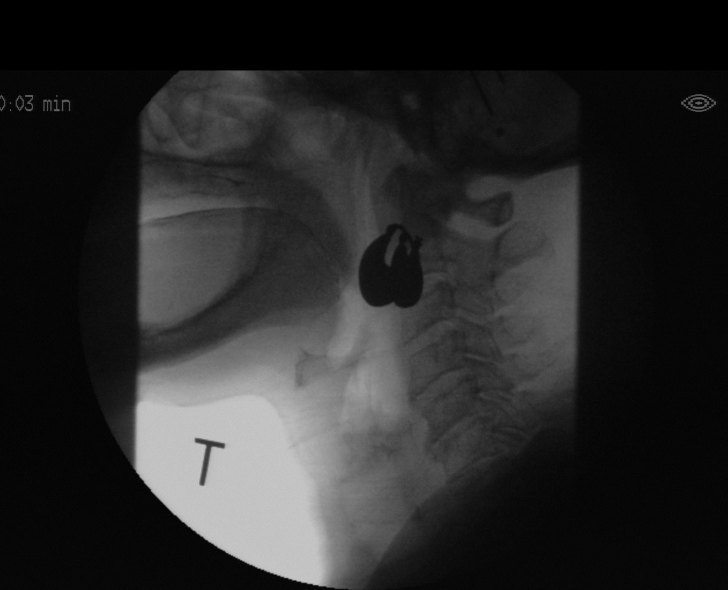

[Series 4: run · 1 of 93 frames shown (3 of 12)]
[frame 14/93]
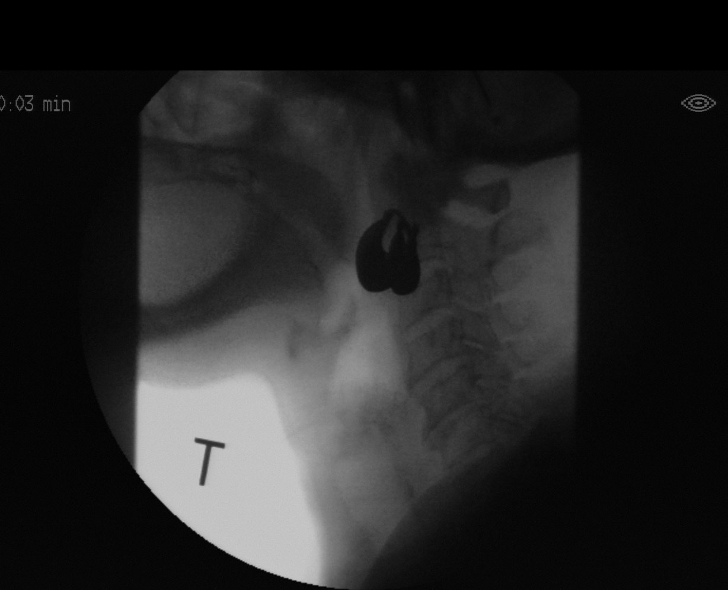

[Series 5: run · 1 of 121 frames shown (4 of 12)]
[frame 103/121]
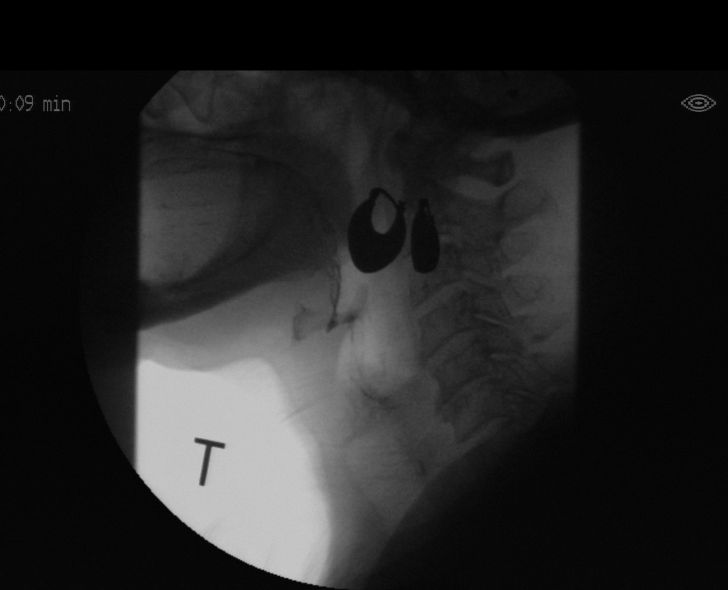

[Series 7: run · 1 of 122 frames shown (5 of 12)]
[frame 3/122]
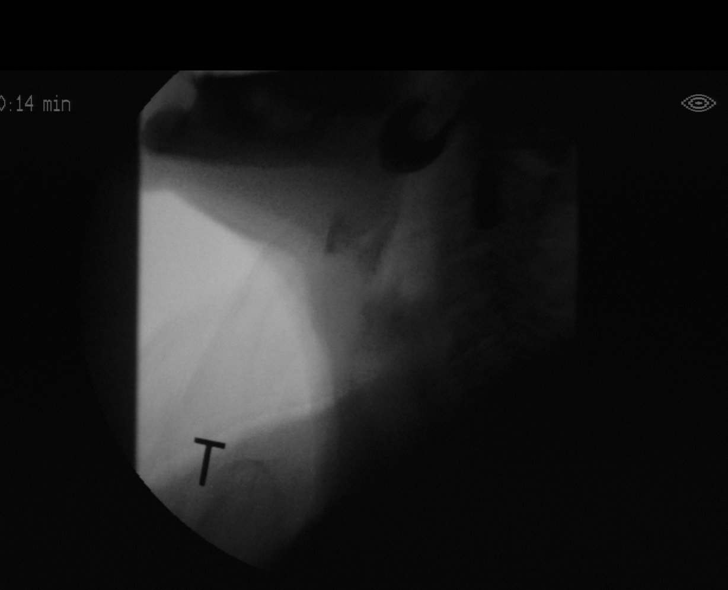

[Series 8: run · 1 of 334 frames shown (6 of 12)]
[frame 168/334]
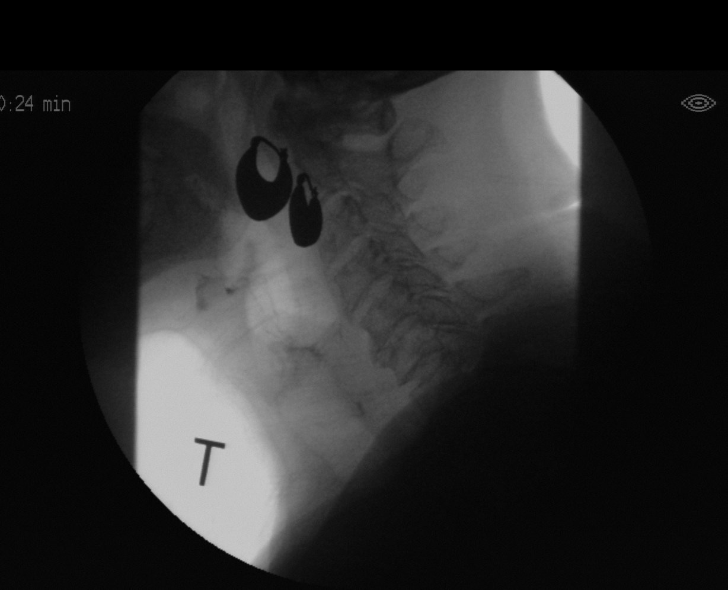

[Series 9: run · 1 of 27 frames shown (7 of 12)]
[frame 23/27]
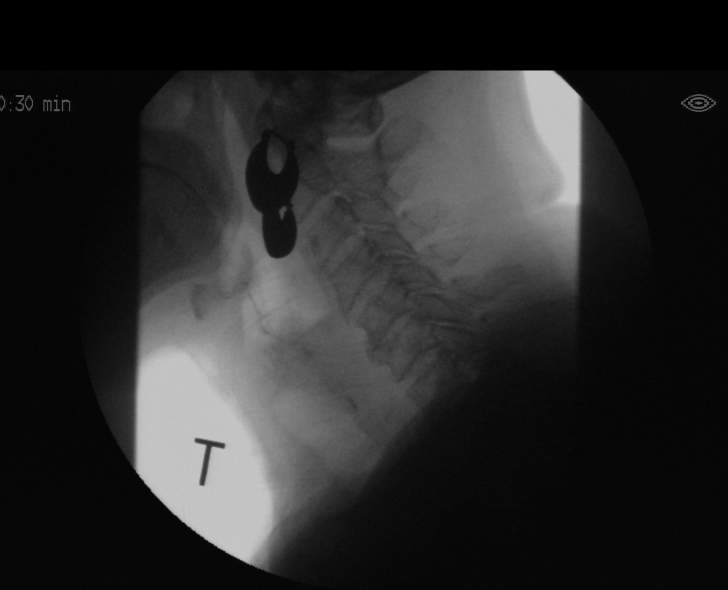

[Series 11: run · 1 of 94 frames shown (8 of 12)]
[frame 15/94]
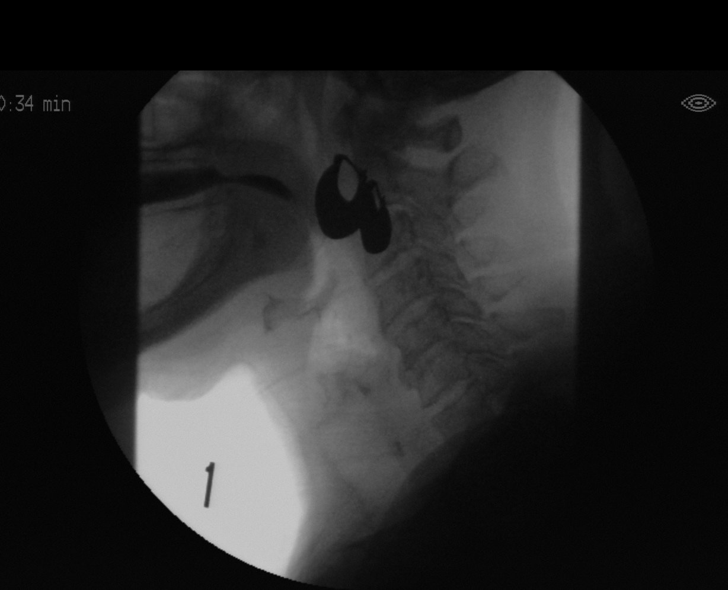

[Series 12: run · 1 of 244 frames shown (9 of 12)]
[frame 208/244]
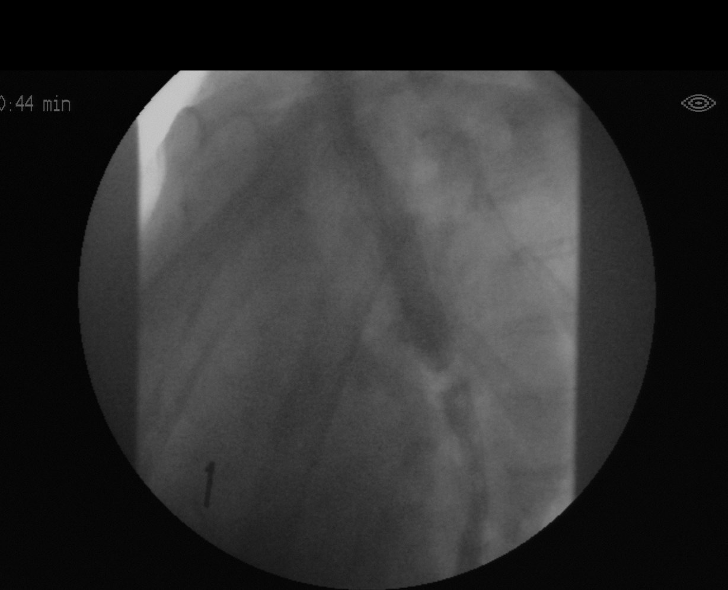

[Series 14: run · 1 of 196 frames shown (10 of 12)]
[frame 1/196]
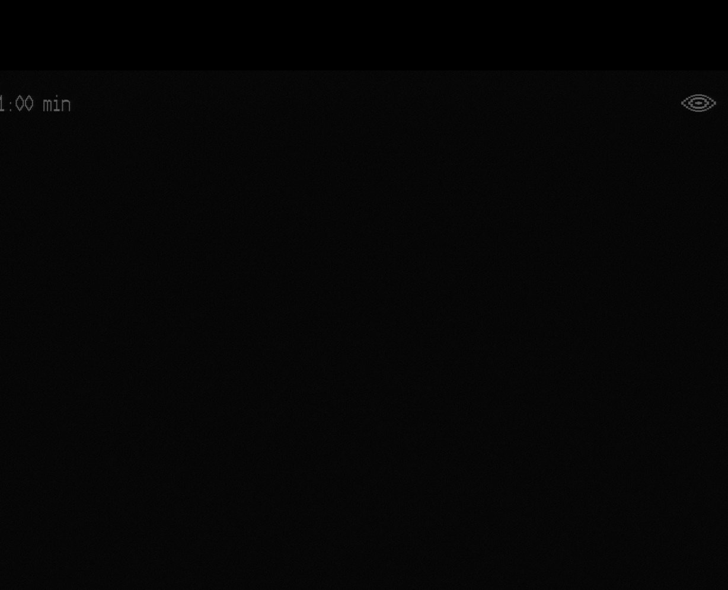

[Series 15: run · 1 of 116 frames shown (11 of 12)]
[frame 59/116]
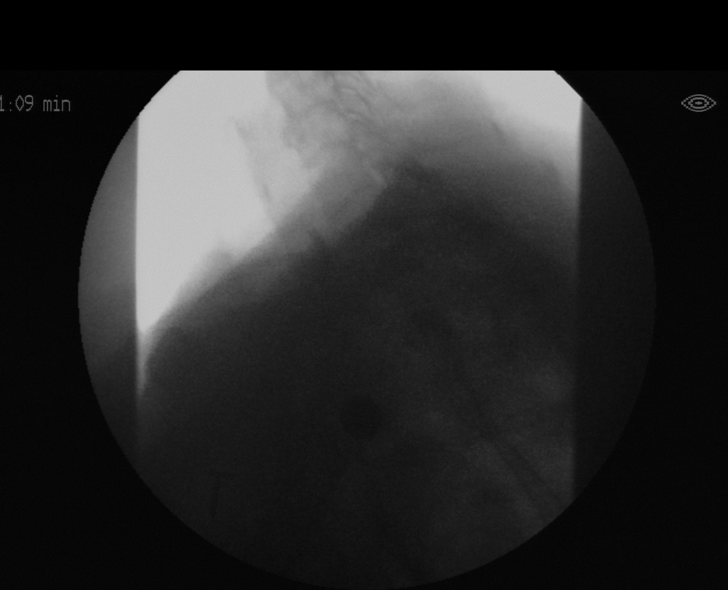

[Series 16: run · 1 of 122 frames shown (12 of 12)]
[frame 120/122]
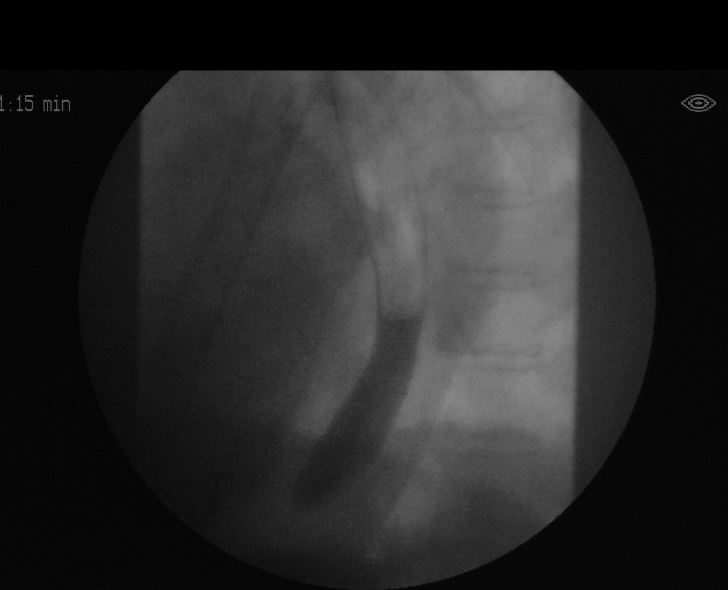

[12 of 24 positions shown; findings below may reference images not displayed]

FINDINGS: Thin liquid- within normal limits

Pure- within normal limits

Pure with cracker- within normal limits

Barium tablet-transiently stuck at the level of the upper thoracic
esophagus, passes promptly with subsequent swallow
IMPRESSION: Barium tablet transiently stuck at the level of the upper thoracic
esophagus, passes promptly with subsequent swallow.

Barium swallow otherwise within normal limits.

Please refer to the Speech Pathologists report for complete details
and recommendations.

## 2019-01-27 ENCOUNTER — Telehealth: Payer: Self-pay | Admitting: Oncology

## 2019-01-27 NOTE — Telephone Encounter (Signed)
Received a new hem referral from Dr. Paulita Fujita for anemia. I cld and spoke to the pt's daughter Rebekah Macdonald and scheduled her mom to see Dr. Alen Blew on 8/21 at [facility] that her mom should arrive 20 minutes early. I made a note for the pt to have a nurse escort to help direct her to where she needs go.

## 2019-02-14 ENCOUNTER — Inpatient Hospital Stay: Payer: Medicare HMO | Admitting: Oncology

## 2022-03-23 ENCOUNTER — Other Ambulatory Visit: Payer: Self-pay | Admitting: Physician Assistant

## 2022-03-23 DIAGNOSIS — R197 Diarrhea, unspecified: Secondary | ICD-10-CM

## 2022-03-23 DIAGNOSIS — R109 Unspecified abdominal pain: Secondary | ICD-10-CM

## 2022-04-18 ENCOUNTER — Other Ambulatory Visit: Payer: Medicare HMO

## 2022-04-20 ENCOUNTER — Other Ambulatory Visit: Payer: Medicare HMO

## 2022-12-25 DEATH — deceased
